# Patient Record
Sex: Female | Born: 1960 | Race: White | Hispanic: No | Marital: Married | Smoking: Former smoker
Health system: Southern US, Community
[De-identification: ages and names within clinical notes are randomized; demographics above are authoritative.]

## PROBLEM LIST (undated history)

## (undated) DIAGNOSIS — E785 Hyperlipidemia, unspecified: Secondary | ICD-10-CM

## (undated) DIAGNOSIS — IMO0001 Reserved for inherently not codable concepts without codable children: Secondary | ICD-10-CM

## (undated) DIAGNOSIS — I2510 Atherosclerotic heart disease of native coronary artery without angina pectoris: Secondary | ICD-10-CM

## (undated) HISTORY — PX: CHOLECYSTECTOMY: SHX55

## (undated) HISTORY — PX: TRIGGER FINGER RELEASE: SHX641

## (undated) HISTORY — PX: ABDOMINAL HYSTERECTOMY: SHX81

## (undated) HISTORY — DX: Atherosclerotic heart disease of native coronary artery without angina pectoris: I25.10

## (undated) HISTORY — PX: TUBAL LIGATION: SHX77

## (undated) HISTORY — DX: Hyperlipidemia, unspecified: E78.5

## (undated) HISTORY — DX: Reserved for inherently not codable concepts without codable children: IMO0001

---

## 2001-03-02 ENCOUNTER — Encounter: Payer: Self-pay | Admitting: Emergency Medicine

## 2001-03-02 ENCOUNTER — Inpatient Hospital Stay (HOSPITAL_COMMUNITY): Admission: EM | Admit: 2001-03-02 | Discharge: 2001-03-05 | Payer: Self-pay | Admitting: Emergency Medicine

## 2001-03-04 ENCOUNTER — Encounter: Payer: Self-pay | Admitting: Internal Medicine

## 2001-03-10 ENCOUNTER — Encounter: Admission: RE | Admit: 2001-03-10 | Discharge: 2001-06-08 | Payer: Self-pay | Admitting: Internal Medicine

## 2001-07-27 ENCOUNTER — Inpatient Hospital Stay (HOSPITAL_COMMUNITY): Admission: EM | Admit: 2001-07-27 | Discharge: 2001-07-29 | Payer: Self-pay | Admitting: Emergency Medicine

## 2001-07-28 ENCOUNTER — Encounter: Payer: Self-pay | Admitting: Internal Medicine

## 2001-09-23 ENCOUNTER — Encounter: Admission: RE | Admit: 2001-09-23 | Discharge: 2001-12-22 | Payer: Self-pay | Admitting: Anesthesiology

## 2002-12-24 ENCOUNTER — Encounter: Payer: Self-pay | Admitting: Emergency Medicine

## 2002-12-24 ENCOUNTER — Emergency Department (HOSPITAL_COMMUNITY): Admission: EM | Admit: 2002-12-24 | Discharge: 2002-12-24 | Payer: Self-pay | Admitting: Emergency Medicine

## 2004-03-20 ENCOUNTER — Encounter: Admission: RE | Admit: 2004-03-20 | Discharge: 2004-03-20 | Payer: Self-pay | Admitting: Internal Medicine

## 2005-08-05 ENCOUNTER — Emergency Department (HOSPITAL_COMMUNITY): Admission: EM | Admit: 2005-08-05 | Discharge: 2005-08-05 | Payer: Self-pay | Admitting: Emergency Medicine

## 2006-08-11 ENCOUNTER — Inpatient Hospital Stay (HOSPITAL_COMMUNITY): Admission: AD | Admit: 2006-08-11 | Discharge: 2006-08-19 | Payer: Self-pay | Admitting: Cardiovascular Disease

## 2006-08-12 ENCOUNTER — Encounter: Payer: Self-pay | Admitting: Cardiovascular Disease

## 2006-08-13 ENCOUNTER — Encounter: Payer: Self-pay | Admitting: Vascular Surgery

## 2006-08-30 ENCOUNTER — Inpatient Hospital Stay (HOSPITAL_COMMUNITY): Admission: EM | Admit: 2006-08-30 | Discharge: 2006-09-02 | Payer: Self-pay | Admitting: Emergency Medicine

## 2006-08-30 ENCOUNTER — Encounter: Payer: Self-pay | Admitting: Cardiology

## 2006-09-02 ENCOUNTER — Emergency Department (HOSPITAL_COMMUNITY): Admission: EM | Admit: 2006-09-02 | Discharge: 2006-09-03 | Payer: Self-pay | Admitting: Emergency Medicine

## 2006-09-03 ENCOUNTER — Inpatient Hospital Stay (HOSPITAL_COMMUNITY): Admission: EM | Admit: 2006-09-03 | Discharge: 2006-09-07 | Payer: Self-pay | Admitting: Emergency Medicine

## 2006-09-11 ENCOUNTER — Ambulatory Visit: Payer: Self-pay | Admitting: Internal Medicine

## 2006-10-08 HISTORY — PX: CORONARY ARTERY BYPASS GRAFT: SHX141

## 2006-11-29 ENCOUNTER — Observation Stay (HOSPITAL_COMMUNITY): Admission: EM | Admit: 2006-11-29 | Discharge: 2006-11-29 | Payer: Self-pay | Admitting: Emergency Medicine

## 2007-01-03 ENCOUNTER — Ambulatory Visit: Payer: Self-pay | Admitting: Thoracic Surgery (Cardiothoracic Vascular Surgery)

## 2007-01-31 ENCOUNTER — Ambulatory Visit (HOSPITAL_BASED_OUTPATIENT_CLINIC_OR_DEPARTMENT_OTHER): Admission: RE | Admit: 2007-01-31 | Discharge: 2007-01-31 | Payer: Self-pay | Admitting: Orthopedic Surgery

## 2007-11-28 IMAGING — CT CT ANGIO ABDOMEN
4 of 6 series · 11 of 46 positions shown, 18 images · IV contrast (APPLIED)
Comparison: CT abdomen 08/30/2006

CLINICAL DATA: Chest pain, back pain, question pulmonary embolus or dissection.

CT ANGIOGRAPHY OF CHEST - PULMONARY EMBOLISM PROTOCOL:
TECHNIQUE: Multidetector CT imaging of the chest was performed according to the
protocol for detection of pulmonary embolism during bolus injection of
intravenous contrast.  Coronal and sagittal plane CT angiographic image
reconstructions were also generated.
Contrast:  100 cc Omnipaque 300
TECHNIQUE: Multidetector CT imaging of the abdomen was performed before and
during bolus injection of intravenous contrast.  Multiplanar CT angiographic
image reconstructions were generated to evaluate the vascular anatomy.

[Series 2: without 5.0 st · axial · non-contrast · 0.63mm/px · z∈[-364,-109]mm · 4 of 86 slices shown, 9 images]
[im 18/86  soft-tissue]
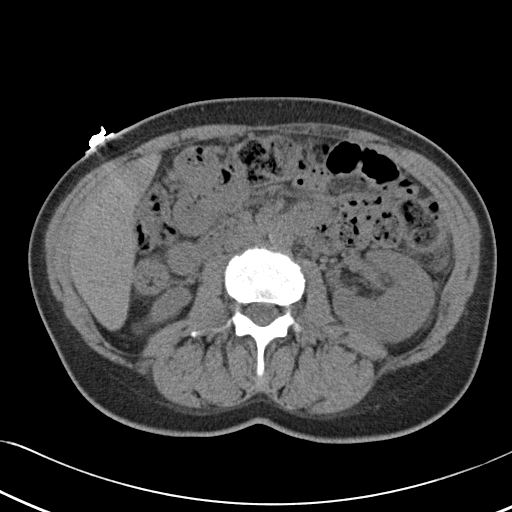
[im 18/86  lung]
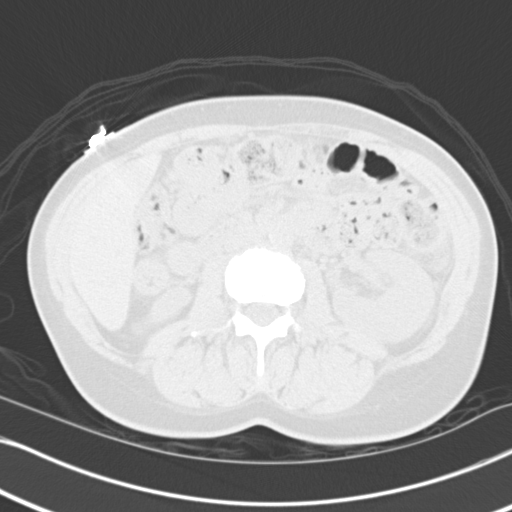
[im 18/86  bone]
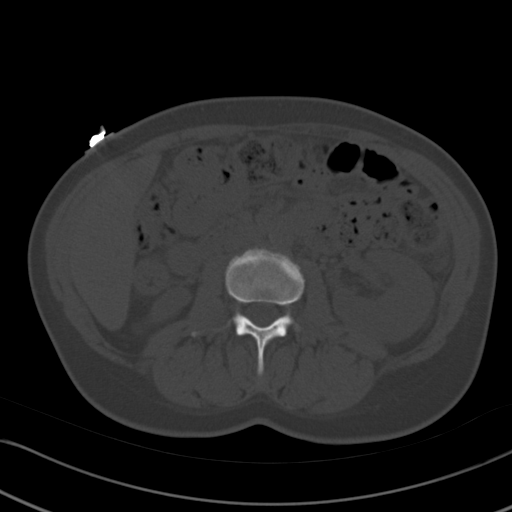
[im 35/86  soft-tissue]
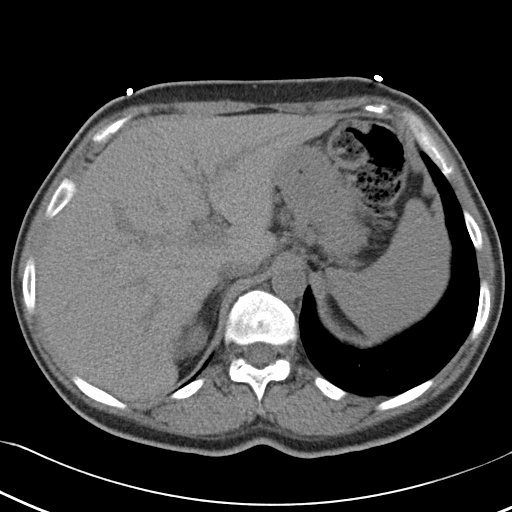
[im 35/86  lung]
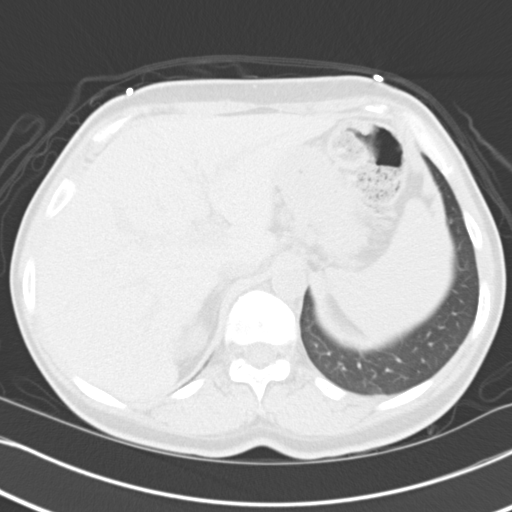
[im 52/86  soft-tissue]
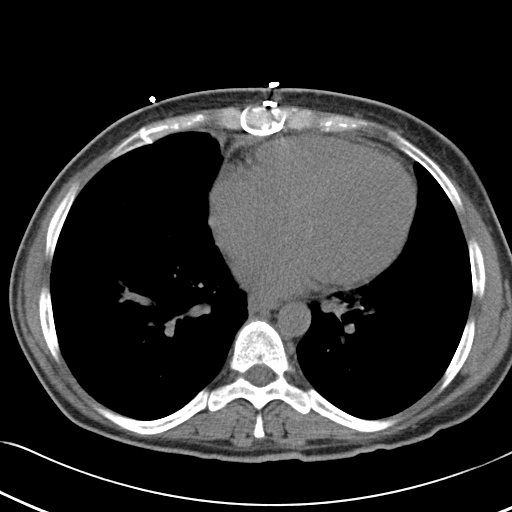
[im 52/86  lung]
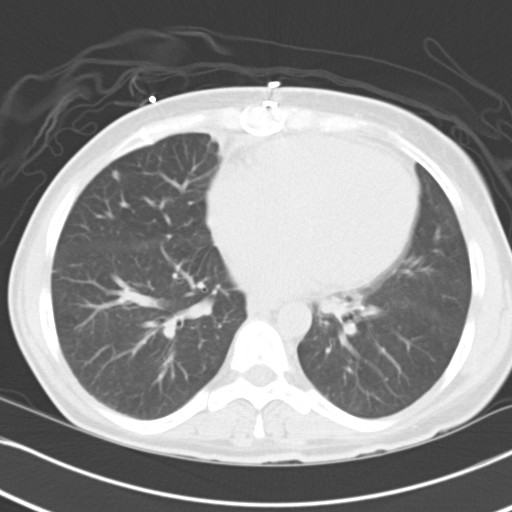
[im 69/86  soft-tissue]
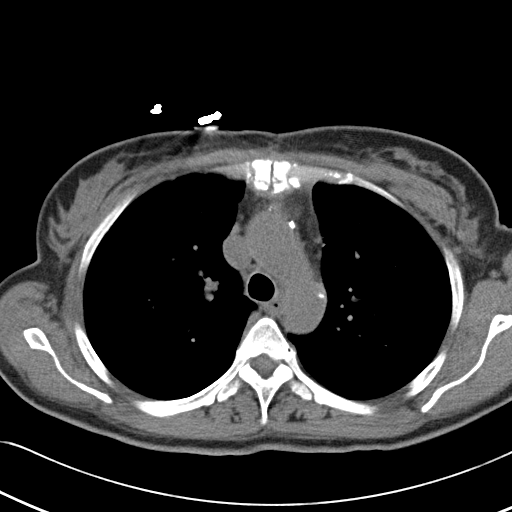
[im 69/86  lung]
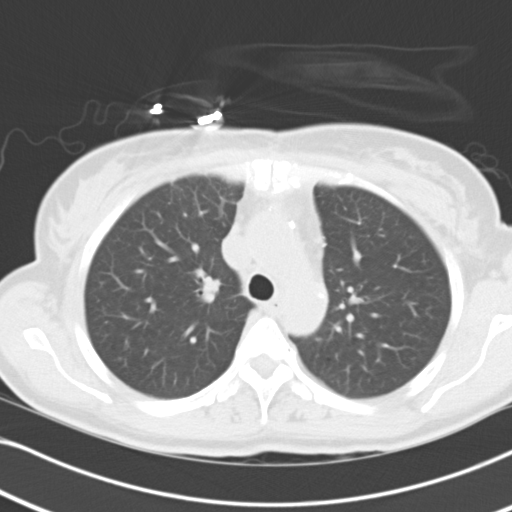

[Series 5: dissection 2.0 st · axial · 0.64mm/px · z∈[-402,-308]mm · 3 of 205 slices shown]
[im 16/205  soft-tissue]
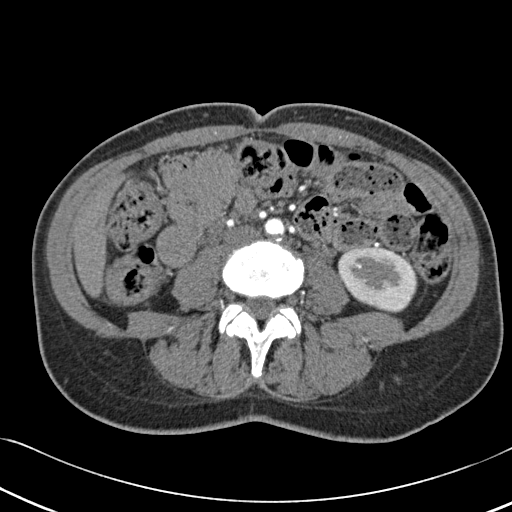
[im 48/205  soft-tissue]
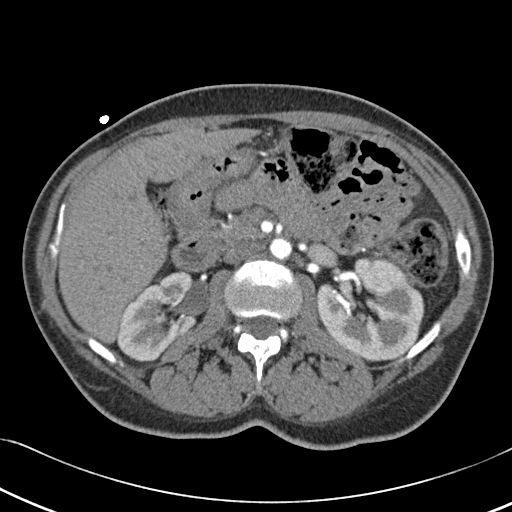
[im 63/205  soft-tissue]
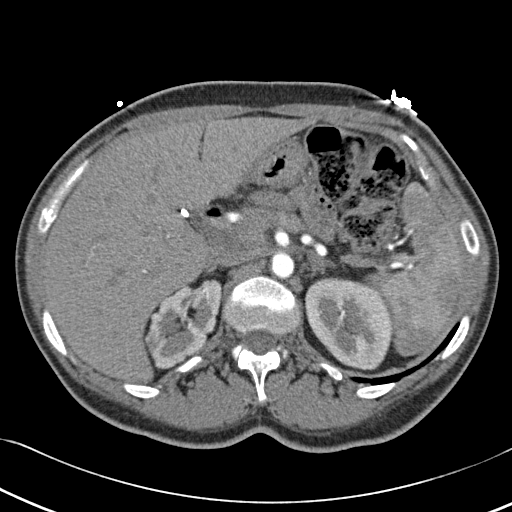

[Series 8: dissection 2.0 st cor · coronal · 0.99mm/px · 3 of 114 slices shown, 4 images]
[im 38/114  soft-tissue]
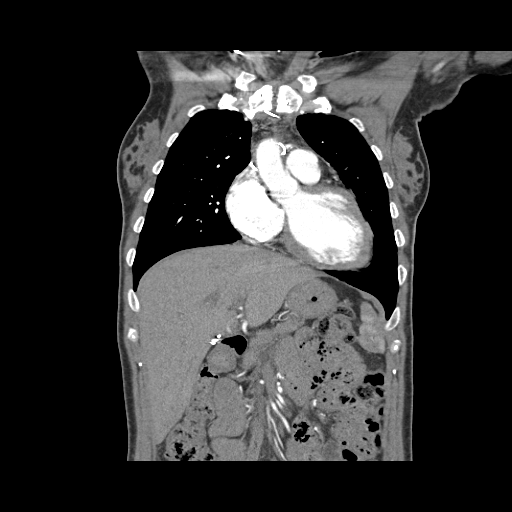
[im 51/114  soft-tissue]
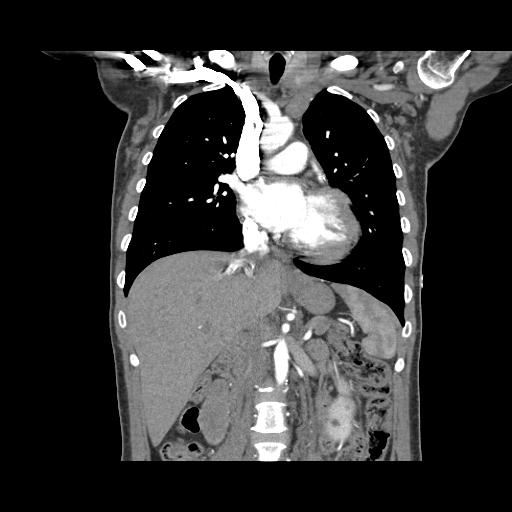
[im 51/114  bone]
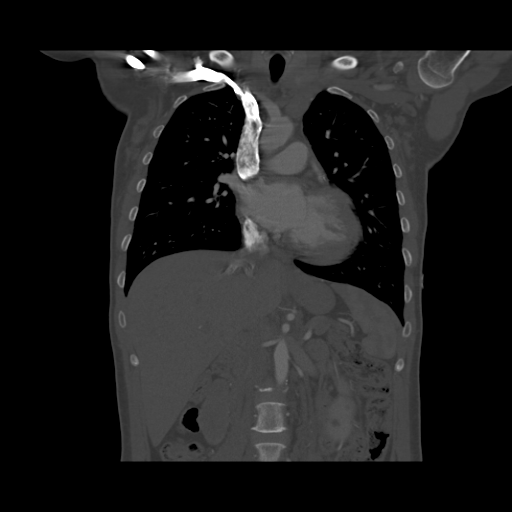
[im 63/114  soft-tissue]
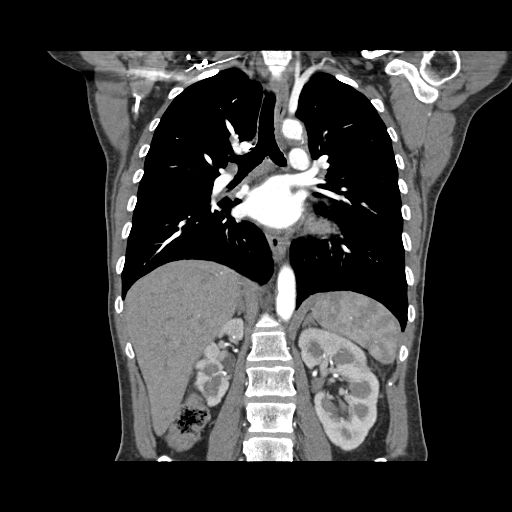

[Series 9: dissection 2.0 st sag · sagittal · 0.99mm/px · 1 of 152 slices shown, 2 images]
[im 51/152  soft-tissue]
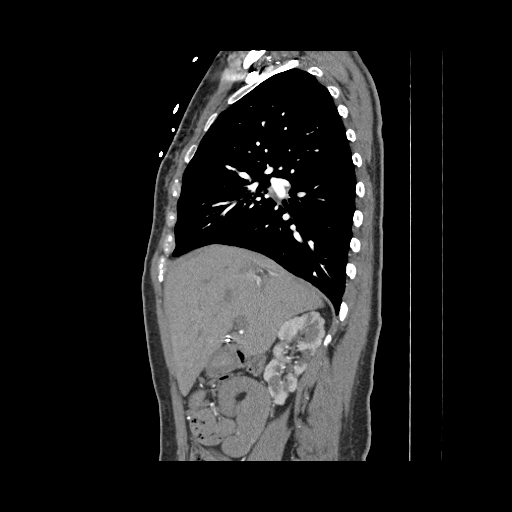
[im 51/152  bone]
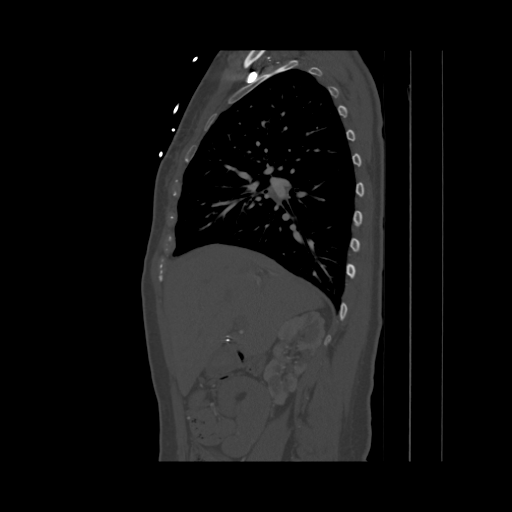

[11 of 46 positions shown; findings below may reference images not displayed]

FINDINGS: No filling defects are seen the pulmonary arteries to suggest
pulmonary emboli. Patient status post CABG. There is no evidence of aortic
aneurysm or dissection. There is mild plaque formation in the descending
thoracic aorta.

There is mild cardiomegaly. No pericardial or pleural effusion. There is mild
peribronchial thickening without focal airspace opacity.

No focal bony abnormality. There is a small calcification within the left
thyroid lobe which may be associated with a small nodule.
IMPRESSION: No evidence pulmonary embolus or aortic dissection. No aneurysm.

Prior CABG.

Mild bronchitis. 

Small calcification in the left thyroid lobe, with possible associated small
nodule. As can be further evaluated with thyroid ultrasound.

CT ANGIOGRAPHY OF ABDOMEN
FINDINGS: No evidence of aortic aneurysm or dissection. There are 2 left renal
arteries and one right renal artery. No evidence of renal artery stenosis. All 3
mesenteric vessels are widely patent.

There is cortical atrophy and scarring involving the right kidney. Given the
normal right renal artery, this most likely represents chronic reflux
nephropathy.

Otherwise solid organs have been unremarkable appearance. No focal bony
abnormality. No free fluid, free air, or adenopathy.

IMPRESSION

No evidence of aortic aneurysm or dissection.

Cortical atrophy/scarring within the right kidney, likely chronic reflux
nephropathy.

## 2009-03-06 ENCOUNTER — Emergency Department (HOSPITAL_BASED_OUTPATIENT_CLINIC_OR_DEPARTMENT_OTHER): Admission: EM | Admit: 2009-03-06 | Discharge: 2009-03-06 | Payer: Self-pay | Admitting: Emergency Medicine

## 2009-03-06 ENCOUNTER — Ambulatory Visit: Payer: Self-pay | Admitting: Diagnostic Radiology

## 2009-07-06 ENCOUNTER — Encounter: Payer: Self-pay | Admitting: Cardiovascular Disease

## 2010-07-05 ENCOUNTER — Ambulatory Visit: Payer: Self-pay | Admitting: Cardiovascular Disease

## 2010-09-19 ENCOUNTER — Ambulatory Visit: Payer: Self-pay | Admitting: Cardiovascular Disease

## 2010-11-14 ENCOUNTER — Other Ambulatory Visit: Payer: Self-pay | Admitting: Internal Medicine

## 2010-11-14 ENCOUNTER — Ambulatory Visit
Admission: RE | Admit: 2010-11-14 | Discharge: 2010-11-14 | Disposition: A | Discharge status: Home / Self Care | Payer: Managed Care, Other (non HMO) | Source: Ambulatory Visit | Attending: Internal Medicine | Admitting: Internal Medicine

## 2010-11-14 DIAGNOSIS — R52 Pain, unspecified: Secondary | ICD-10-CM

## 2011-01-16 LAB — URINE MICROSCOPIC-ADD ON

## 2011-01-16 LAB — URINE CULTURE

## 2011-01-16 LAB — URINALYSIS, ROUTINE W REFLEX MICROSCOPIC
Glucose, UA: NEGATIVE mg/dL
Nitrite: POSITIVE — AB
Specific Gravity, Urine: 1.016 (ref 1.005–1.030)
pH: 6 (ref 5.0–8.0)

## 2011-02-23 NOTE — Discharge Summary (Signed)
NAMEJANNELL, [name] NO.:  [id number]   MEDICAL RECORD NO.:  [id number]          PATIENT TYPE:  INP   LOCATION:  2004                         FACILITY:  [facility]   PHYSICIAN:  Salvatore Decent. Cornelius Moras, M.D. DATE OF BIRTH:  1961/01/20   DATE OF ADMISSION:  08/11/2006  DATE OF DISCHARGE:  08/19/2006                                 DISCHARGE SUMMARY   PRIMARY ADMITTING DIAGNOSIS:  Shortness of breath.   ADDITIONAL/DISCHARGE DIAGNOSES:  1. Flash pulmonary edema.  2. Severe three-vessel coronary artery disease.  3. Severe left ventricular dysfunction.  4. Class IV congestive heart failure.  5. Insulin dependent diabetes mellitus.  6. Hyperlipidemia.  7. History of tobacco abuse.  8. Questionable pre admission acute myocardial infarction.  9. Postoperative blood loss anemia.   PROCEDURE PERFORMED:  1. Cardiac catheterization.  2. Coronary artery bypass grafting x4 (left internal mammary artery to the      distal left anterior descending, saphenous vein graft to the first      diagonal, saphenous vein graft to the ramus intermedius, saphenous vein      graft to the distal right coronary artery).  3. Endoscopic vein harvest right leg.   HISTORY:  The patient is a 50 year old female who presented to the emergency  department at [facility] on the date of this admission complaining with  diaphoresis and episodic chest pain.  She had been having progressive  dyspnea and fatigue over the several days preceding the admission as well as  some diaphoresis and backache.  On exam in the emergency department she was  found to have pulmonary edema and was treated with IV Lasix and a low-dose  nitroglycerin drip and was transferred to [facility] for admission and  further cardiac workup.   HOSPITAL COURSE:  Ms. Lovins was ruled out for an acute myocardial  infarction although her clinical history was suggestive of possible  myocardial infarction prior to admission.  She underwent  cardiac  catheterization which showed severe three-vessel coronary artery disease  which was not felt to be amenable to percutaneous intervention.  There was  also some question as to whether she may have an atrial septal defect, but  transesophageal echocardiogram was performed and showed no evidence of an  ASD.  She was seen in consultation by Dr. Tressie Stalker for consideration of  surgical revascularization.  After evaluation of the patient and review of  her films Dr. Cornelius Moras felt that her best course of action would be to proceed  with CABG at this time.  He explained the risks, benefits and alternatives  of surgery to the patient and she agreed to proceed.  She underwent a  complete preoperative workup including carotid Doppler studies which showed  no evidence of ICA stenosis bilaterally and lower extremity Doppler studies  which demonstrated ABIs of 0.97 on the right and 0.92 on the left.  She  remained stable during her preoperative workup and was taken to the  operating room on 08/15/2006 where she underwent CABG x4 as described in  detail above performed by Dr. Cornelius Moras.  She tolerated the procedure well  and  was transferred to the SICU in stable condition.  She was able to be  extubated shortly after surgery.  She was hemodynamically stable and doing  well on postop day #1 although she did require a low-dose Neo-Synephrine  drip for some mild hypotension.  This was weaned and discontinued over the  course of her first postoperative day.  Her chest tubes and hemodynamic  monitoring devices were removed and she was able to ambulate with cardiac  rehab phase one.  She remained in the unit for further observation.  By  postop day #3 she was ready for transfer to the floor.  She was started back  on her home insulin regimen once her p.o. intake began to improve.  Also  once her blood pressure stabilized she was started on an ACE inhibitor and a  beta blocker.  She was found to have a mild  blood loss anemia which was not  severe enough to require transfusion and she was subsequently started on  oral iron and folic acid.  She was diuresed for mild volume overload.  She  continued to make slow and steady progress.  Her incisions are all healing  well.  She has been diuresed back down to around her preoperative weight  with no lower extremity edema on physical exam.  She has remained afebrile  and vital signs have been stable.  She is maintaining normal sinus rhythm.  Her labs on postop day #4 show hemoglobin of 9.4, hematocrit 28, platelet  302, white count 12.8, sodium 139, potassium 3.7, BUN 16, creatinine 0.6.  She has been seen and evaluated this morning by Dr. Cornelius Moras and by Dr. Elease Hashimoto  and it is felt that she has remained stable and is doing so well she is  ready for discharge home.   Discharge medications are as follows.  1. Aspirin 325 mg daily.  2. Toprol XL 25 mg daily.  3. Lisinopril 20 mg daily.  4. Lipitor 40 mg nightly  5. Zoloft 100 mg daily.  6. Neurontin 600 mg four times a day.  7. Lasix 40 mg daily times a 5 days.  8. Potassium 20 mEq daily x5 days.  9. Niferex 150 mg daily.  10.Folic acid 1 mg daily.  11.Humulin 50 units subcu q. a.m. and 35 units subcu q. p.m.  12.Oxycodone 5 mg q. 4-6 hours p.r.n. for pain.   DISCHARGE INSTRUCTIONS:  She is asked to refrain from driving, heavy lifting  or strenuous activity.  She may continue ambulating daily and using her  incentive spirometer.  She may shower daily and clean her incisions with  soap and water.  She will continue low-fat, low-sodium, carbohydrate  modified diet.   DISCHARGE FOLLOWUP:  She is asked to call and schedule an appointment in 2  weeks to see Dr. Elease Hashimoto.  She will be contacted by the CVTS office with an  appointment see Dr. Cornelius Moras in 3 weeks.  She is also asked to follow up with  her medical doctor for recheck of her blood sugars in the next 1-2 weeks. If she experiences any problems or has  questions in the interim she is asked  to contact our office immediately.      Coral Ceo, P.A.      Salvatore Decent. Cornelius Moras, M.D.  Electronically Signed    GC/MEDQ  D:  08/19/2006  T:  08/19/2006  Job:  [id number]   cc:   Salvatore Decent. Cornelius Moras, M.D.  Olene Craven, M.D.  Thayer Headings, M.D.

## 2011-02-23 NOTE — H&P (Signed)
NAMELADELL, BEY NO.:  [id number]   MEDICAL RECORD NO.:  [id number]          PATIENT TYPE:  EMS   LOCATION:  ED                           FACILITY:  [facility]   PHYSICIAN:  Vesta Mixer, M.D. DATE OF BIRTH:  [birth year]   DATE OF ADMISSION:  08/11/2006  DATE OF DISCHARGE:                                HISTORY & PHYSICAL   [name] is a 50 year old female with a history of diabetes mellitus.  She is admitted with severe dyspnea for the past week or so and flash  pulmonary edema.   The patient has had poorly controlled diabetes for the last 12 years.  Her  last hemoglobin A1c was in the 8 range.   Seven days ago she started having some episodes of chest pain.  This chest  pain was described as a deep pressure with radiation through to her back.  It was associated with diaphoresis and what she thought was heartburn.  She  developed cold clammy sensation.  She took an aspirin and felt somewhat  better after about 30 minutes.  She felt well for several days and then  started having some progressive dyspnea.  She also noticed progressive  fatigue since that time.  She felt a little bit better on Thursday, Friday  and yesterday and then finally today she felt much worse.  Today she was not  able to even got to the bathroom without having severe dyspnea.  She had to  stop and rest just to walk  as little as 20 feet.  She has not been able to  lie down because of her shortness of breath.  She has not had much in the  way of chest pain but now has a little bit of a right-sided chest ache as  well as a backache.  She presented to the [facility] emergency room and was  found to have flash pulmonary edema.  She received some IV Lasix and low-  dose nitroglycerin drip and is feeling much better.   CURRENT MEDICATIONS:  1. Insulin NPH 50 units in morning with 35 units in the evening.  2. Humalog sliding scale.  3. Neurontin 600 mg 4 x day.  4. Vicodin 1 tablet  4 x day.  5. Zoloft 100 mg a day.  6. Lovastatin once a day.   ALLERGIES:  None.   PAST MEDICAL HISTORY:  Diabetes mellitus.   SOCIAL HISTORY:  The patient smokes somewhere less than a pack of cigarettes  a day.  She drinks alcohol rarely.   FAMILY HISTORY:  Positive for coronary artery disease.   REVIEW OF SYSTEMS:  Reviewed and is essentially negative except as noted in  the HPI.   PHYSICAL EXAMINATION:  GENERAL:  She is a young female in no acute distress.  She is alert and oriented x3.  Mood and affect are normal.  VITALS:  Her heart rate is 97, blood pressure is 114/71.  HEENT exam reveals 2+ carotids.  She has no bruits, no JVD, no thyromegaly.  LUNGS:  Lung exam reveals rales bilaterally.  HEART:  Regular rate S1-S2  with an S3 gallop.  ABDOMEN:  Reveals good bowel sounds and is nontender.  EXTREMITIES:  She has no clubbing, cyanosis or edema.  Her femoral pulses  are 2+.   Chest x-ray reveals significant bilateral pulmonary edema. Her EKG reveals  normal sinus rhythm.  She has ST-segment depression in leads II, III and  AVF.  She also has ST-segment depression in C5 and C6.  She has poor R-wave  progression.  She has small Q-waves in leads II, III and AVF.   Her troponin level is 0.37.  CPK-MB is negative.   IMPRESSION AND PLAN:  1. [name] presents with flash pulmonary edema.  By her EKG history and      enzymes, I suspect that she had a myocardial infarction starting last      week.  She now has subsequent congestive heart failure.  She is not      having any acute pain now.  She has responded nicely to nitroglycerin      and Lasix.  At this point I would like to get her out of her congestive      heart failure.  We will continue the IV Lasix twice a day for the next      several days.  We will increase her nitroglycerin drip to 3 mL an hour.      We will check a BMP tomorrow.   Will get an echocardiogram tomorrow to evaluate her left ventricular  systolic  function.  I anticipate doing heart catheterization Tuesday  afternoon.  As long she does not have recurrent chest pain or worsening  dyspnea then I think that we can wait that long.  I would certainly prefer  to get her out of congestive heart failure before we do an angiogram.   We will check a fasting lipid profile tomorrow.   1. Diabetes mellitus.  Will check Accu-Chek's and continue her home      medications.  We will give her sliding scale insulin as needed.           ______________________________  Vesta Mixer, M.D.     PJN/MEDQ  D:  08/11/2006  T:  08/12/2006  Job:  [id number]   cc:   Olene Craven, M.D.  Fax: [phone]

## 2011-02-23 NOTE — Discharge Summary (Signed)
[name], [name] NO.:  [id number]   MEDICAL RECORD NO.:  [id number]          PATIENT TYPE:  INP   LOCATION:  2008                         FACILITY:  [facility]   PHYSICIAN:  Vesta Mixer, M.D. DATE OF BIRTH:  06-Mar-1961   DATE OF ADMISSION:  11/28/2006  DATE OF DISCHARGE:  11/29/2006                               DISCHARGE SUMMARY   DISCHARGE DIAGNOSES:  1. Noncardiac chest pain.  2. History of coronary artery disease.  3. Diabetes mellitus.  4. Dyslipidemia.   DISCHARGE MEDICATIONS:  1. Aspirin 81 mg a day.  2. Hydrocodone as needed.  3. Sertraline 100 mg a day.  4. Gabapentin 600 mg four times a day.  5. Metoprolol 25 mg a day.  6. Protonix 40 mg (or Prilosec 20 mg each day).  7. Reglan 10 mg with meals.  8. Lisinopril 20 mg a day.  9. Lipitor 40 mg a day.  10.Insulin as you were previously doing.  11.Nitroglycerin as needed.   DISPOSITION:  The patient will return to see Dr. Elease Hashimoto on December 16, 2006.  She is to call sooner if she has any problems.   HISTORY:  [name] is a 50 year old female with a history of coronary  artery disease-status post coronary artery bypass graft.  She was  admitted to the hospital with episodes of chest pain.  Please see  dictated H&P for further details.   HOSPITAL COURSE:  Problem 1:  CHEST PAIN:  The patient was admitted with  chest pain.  She ruled out for  myocardial infarction.  She had negative  cardiac enzymes.  We discontinued the heparin and nitroglycerin and she  did not have any further problems.  She is now discharged in  satisfactory condition.   Problem 2:  DIABETES MELLITUS:  The patient's glucose levels were fairly  elevated during her hospitalization.  Most of this was because she had  been taken off her normal insulin regimen.  She is to resume her normal  insulin dose and she is to follow up with her general medical doctor as  needed.  All of her other medical problems are stable.        ______________________________  Vesta Mixer, M.D.     PJN/MEDQ  D:  11/29/2006  T:  11/30/2006  Job:  [id number]

## 2011-02-23 NOTE — Discharge Summary (Signed)
[name], CAPELLE NO.:  [id number]   MEDICAL RECORD NO.:  [id number]          PATIENT TYPE:  INP   LOCATION:  5530                         FACILITY:  [facility]   PHYSICIAN:  Hettie Holstein, D.O.    DATE OF BIRTH:  Mar 08, 1961   DATE OF ADMISSION:  09/03/2006  DATE OF DISCHARGE:  09/07/2006                               DISCHARGE SUMMARY   PRIMARY CARE PHYSICIAN:  Olene Craven, M.D.   GASTROENTEROLOGIST:  Jordan Hawks. Elnoria Howard, MD   CVTS SURGEON:  Salvatore Decent. Cornelius Moras, M.D.   REASON FOR ADMISSION:  Intractable nausea, vomiting, and abdominal pain.   PRINCIPAL DIAGNOSIS:  1. 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  Ms. Kuper was seen by Dr. Elnoria Howard as well as Dr. Leone Payor.      On the day of discharge, Dr. Leone Payor recommended transitioning her      to Reglan p.o. and Protonix.  She had no further emesis or nausea      after 24 hour p.o. trial and IV Reglan.  2. Helicobacter pylori antibody positive; however, no confirmatory CLO      test or urease breath test was performed to confirm.  Dr. Leone Payor      recommends a followup to confirm this in the outpatient setting,      although she would need to be off of proton pump inhibitor two      weeks prior to testing.  Otherwise, she could undergo eradication      treatment for this, as she has not undergone this in the past.      This will be deferred to her primary care physician, Dr. Barbee Shropshire,      who I am requesting to see her in followup in the coming week.  3. Diabetes mellitus with poor control, as revealed by hemoglobin A1C      of 8.3 on August 14, 2006 around her perioperative period for her      CABG.  4. Coronary artery disease, status post coronary artery bypass      grafting by Dr. Tressie Stalker on August 15, 2006.  5. Diabetic neuropathy affecting lower extremities.  6. Transient hypokalemia, repleted.   DISPOSITION:  Ms. Albano is felt to be in medically stable and improved  condition for discharge to follow up with Dr. Barbee Shropshire this week for  consideration of tighter management of her diabetes as an exacerbating  factor of her gastroparesis.   DISCHARGE MEDICATIONS:  1. Reglan 10 mg q.a.c. and nightly.  2. Protonix 40 mg daily.   She is instructed to limit her pain medication as much as possible,  as  this is certainly has a possibility of contributing to further  exacerbations.  She should resume lisinopril 20 mg daily as before,  gabapentin 600 mg 4 times a day as before, Lipitor 40 mg daily as  before, promethazine 25 mg as needed for nausea, hydromorphone 2 mg  q.6h. p.r.n., as noted above, recommendations to refrain from as much as  possible.  Hydrocodone/APAP 5/500 p.r.n. q.6h.  Metoprolol extended  release 25 mg daily, Humulin N 50 units q.a.m. and Humulin 35 units  nightly and a Humalog sliding scale that she was on at the home.  Again,  she was instructed to tighten control in the outpatient setting.   HISTORY OF PRESENT ILLNESS:  Ms. Pesnell is a very pleasant 50 year old  female who presented to the hospital with nausea, vomiting, and  abdominal pain.  She has a known history of type 1 diabetes mellitus and  coronary artery disease with recent coronary artery bypass grafting per  Dr. Cornelius Moras x4 vessel.  In any event, she had a recent hospitalization and  was discharged home; however, had recurrence of symptoms with bilious  vomiting.  She was admitted for hydration and further management.   HOSPITAL COURSE:  Ms. Macbride was seen in consultation by  gastroenterology initially.  She had initial plain films as  well as an  ultrasound of the abdomen.  Ultrasound of her abdomen revealed  surgically absent gallbladder, only mildly dilated extra-hepatic biliary  tree, mildly atrophic right kidney, and questionable compensatory  hypertrophy of the left kidney.  In any event, there was some concern  for perhaps mesenteric ischemia.  She underwent mesenteric arteriogram  on September 04, 2006, which revealed a patent SMA, IMA, and celiac  branch vessels.  She continued with discomfort.  We subsequently  proceeded to work up delayed gastric emptying.  She was unable to  complete the first attempt at this study due to nausea and vomiting.  She had subsequent attempt and was successful.  This did reveal delayed  gastric emptying.  Diet was attempted following, and she responded to  Reglan and tolerated the diet without emesis or vomiting.  She has felt  the best that she has felt in quite some time.  With the initiation of  Reglan, she is seen by Dr. Leone Payor with the recommendations as outlined  above.  Of note, her H. pylori antibody was positive.  She denies known  treatment for this in the past, and this further treatment is deferred  to her outpatient physician.  It is felt that she should resolve this  acute event here prior to treatment of this.  In addition, she can also  undergone confirmatory testing, including CLO test or urease breath  test.   In review of her history, it is noted that she did have a hemoglobin A1C  reflecting poor control.  This will be very important to manage in the  outpatient setting.   Out of sequence, though, Ms. Guyette did undergo an EGD that revealed  only diffuse gastritis.  This was prior to pursuit of mesenteric  angiogram.      Hettie Holstein, D.O.  Electronically Signed     ESS/MEDQ  D:  09/07/2006  T:  09/07/2006  Job:  [id number]   cc:   Olene Craven, M.D.  Jordan Hawks Elnoria Howard, MD  Salvatore Decent. Cornelius Moras, M.D. Vesta Mixer, M.D.  Anselmo Rod, M.D.

## 2011-02-23 NOTE — Discharge Summary (Signed)
[name]. [facility]  Patient:    [name], [name]                      MRN: [id number] Adm. Date:  [id number] Disc. Date: 03/05/01 Attending:  Jetty Duhamel T CC:         Kern Reap, M.D.   Discharge Summary  DISCHARGE DIAGNOSES: 1. Diabetic ketoacidosis. 2. Uncontrolled type 2 diabetes - diagnosed six years ago. 3. Depression. 4. Tobacco abuse. 5. Borderline hyperlipidemia per history.  DISCHARGE MEDICATIONS: 1. Actos 30 mg q.d. 2. Amaryl 4 mg q.d. 3. NPH insulin - 10 units in the morning and 5 units in the evening. 4. Humalog sliding scale - for temporary transition while Actos and Amaryl    are initiated. 5. Zoloft 100 mg p.o. q.d. 6. Doxepin 10 mg q. h.s. p.r.n.  FOLLOWUP:  The patient will follow up with Dr. Kern Reap at [facility] on Wednesday, March 12, 2001, at 10:15 in the morning.  She is instructed to call the [facility] until that time if she has questions concerning her diabetes or blood sugars.  PROCEDURES:  None.  CONSULTATIONS:  None.  HISTORY OF PRESENT ILLNESS:  [name] is a 50 year old female with insulin-dependent type 2 diabetes who was previously followed by Dr. Record in [place], [place].  She presented to the hospital after a two day history of vomiting and extreme nausea with limited p.o. intake.  On the date of admission, she noticed blood sugars in the 400 to 500 range and without ability to take in orals, was unsure as to whether to dose herself with insulin or not.  She presented to the emergency room for evaluation and was found to be in diabetic ketoacidosis and, therefore, was admitted for control.  HOSPITAL COURSE:  #1 - DIABETIC KETOACIDOSIS:  The patient was admitted to the hospital on Mar 02, 2001, with the diagnosis of diabetic ketoacidosis.  Bicarb was noted to be 7, with an anion gap of 20.  Arterial blood gas revealed pH of 7.16. Admission blood sugar was  600.  The patient was placed on Glucometer protocol and hydrated aggressively.  Blood sugars were followed closing, according to the Glucometer protocol.  She responded nicely to this therapy and anion gap was closed within the first 24 hours of admission.  She was transitioned to longstanding NPH insulin with additional Humalog sliding scale.  The patient tolerated this intervention without difficulty.  Anion gap continued to be normal during the remainder of hospitalization.  The patient was tolerating NPH insulin with sliding scale with blood sugars ranging from the 120 to 200 range throughout the remainder of hospitalization.  At the time of discharge, after discussion with Dr. Barbee Shropshire, the decision was made to increase the patients oral hypoglycemic agents.  The medications noted above were prescribed.  Additionally, the patients NPH insulin was decreased in an attempt to decrease the possibility of severe hypoglycemia.  In order to combat hyperglycemia, because of delay in onset of oral hypoglycemics combined with decreasing pH, the patient was placed on Humalog sliding scale and this was explained to her at length prior to her discharge.  The patient voiced understanding.  She is to check CBGs b.i.d. and treat them with her regular insulin, as well as continue the prescribed NPH.  She understands that she will call the [facility] prior to her visit with Dr. Barbee Shropshire for questions concerning her CBG.  She has been consulted  to return to the emergency room for CBGs less than 50 and to call her physician for CBGs continuously less than 70.  Likewise, she is instructed to call her physician for blood sugars that are continuously higher than 350.  The etiology of her DKA was felt to be a viral gastroenteritis as the symptoms of abdominal discomfort and vomiting preceded elevated sugars and acidosis.  #2 - TYPE 2 DIABETES:  The patients diabetes were diagnosed approximately  six years ago.  She, by history, was given a one week trial of Glucotrol alone and then placed on insulin and since that time has not been on any oral agent with the exception to recently added Avandia at a low dose.  In that the patient has a severe type 2 diabetes, it is likely that she may require insulin continuously, but certainly without a full trial of oral agents we cannot decide at this point that she is not able to be controlled with oral agents alone.  This has been discussed with Dr. Barbee Shropshire and initiation of an oral regimen was accomplished at the time of discharge.  Glucophage will be added in followup, but it was held at this time as the patient is recently recovering from diabetic ketoacidosis with a gastroenteritis and we do not wish to aggravate a tender GI tract at this time.  The patient is being sent for outpatient diabetic education and counseling has been carried out extensively as to proper management of diabetes, as well as diabetic diet during this hospitalization.  #3 - TOBACCO ABUSE:  The relationship between uncontrolled blood sugars and tobacco abuse leading to severe coronary artery disease, as well as peripheral vascular disease and increased risk of stroke and heart attack, was explained at length to the patient.  She voiced understanding.  She is highly motivated to discontinue tobacco altogether.  She was encouraged in this and nicotine patch was used during hospitalization.  The patient states that she already had nicotine patches at home and she will use these to continue to stop. Other behavioral modifications including use of sugar-free gums and candies to replace the habitual act of smoking breaks was also encouraged.  #4 - DEPRESSION:  During this hospitalization, the patient tolerated her Zoloft without difficulty.  She will continue this medication as an outpatient.  #5 - VIRAL GASTROENTERITIS:  It was presumed that the patient was  suffering from a viral gastroenteritis which ultimately led to her DKA.  She was  hydrated aggressively during admission, consistent with treatment of volume depletion from DKA and viral gastroenteritis.  Diet was advanced when DKA was resolving and patients nausea and improved.  At the time of discharge, the patient was tolerating a full diet without significant difficulty.  CONDITION AT DISCHARGE:  Much improved.  Vital signs stable and afebrile. Blood sugar 140. DD:  03/05/01 TD:  03/05/01 Job: [id number]

## 2011-02-23 NOTE — Op Note (Signed)
[name], [name]             ACCOUNT NO.:  [id number]   MEDICAL RECORD NO.:  [id number]          PATIENT TYPE:  AMB   LOCATION:  DSC                          FACILITY:  [facility]   PHYSICIAN:  Katy Fitch. Sypher, M.D. DATE OF BIRTH:  01-Nov-1960   DATE OF PROCEDURE:  01/31/2007  DATE OF DISCHARGE:                               OPERATIVE REPORT   PREOPERATIVE DIAGNOSIS:  Chronic stenosing tenosynovitis, right long  finger, associated with longstanding insulin-dependent diabetes and  multiple collagen complications related to longstanding insulin-  dependent diabetes including palmar fibromatosis and multiple joint  contractures associated with insulin-dependent diabetes.   POSTOPERATIVE DIAGNOSIS:  Chronic stenosing tenosynovitis, right long  finger, associated with longstanding insulin-dependent diabetes and  multiple collagen complications related to longstanding insulin-  dependent diabetes including palmar fibromatosis and multiple joint  contractures associated with insulin-dependent diabetes.   OPERATION:  Release of right long finger A1 pulley, followed by  tenosynovectomy of flexor digitorum profundus and superficialis tendons,  right long finger.   OPERATING SURGEON:  Katy Fitch. Sypher, MD   ASSISTANT:  Annye Rusk, PA-C.   ANESTHESIA:  General by LMA.   SUPERVISING ANESTHESIOLOGIST:  Jairo Ben, MD   INDICATIONS:  [name] is a 50 year old woman with a history of  insulin-dependent diabetes with significant stenosing tenosynovitis of  her long fingers bilaterally.   She has failed nonoperative measures.   She has been challenged to maintain close control of her blood sugar.   Due to a failure to respond to nonoperative measures, she is brought to  the operating room at this time, anticipating release of her right long  finger A1 pulley and other interventions to allow recovery of full range  of motion of her right long finger and release of her  30-degree flexion  contracture of the PIP joint.   PROCEDURE:  [name] was brought to the operating room and placed  in a supine position upon the operating table.  Following anesthesia  consultation with Dr. Jean Rosenthal, we initially selected monitored  anesthesia care.  After informed consent, she was provided IV sedation  followed by infiltration of 2% lidocaine into the flexor sheath and palm  in the region of the anticipated incision.   After routine Betadine scrub and paint, the right arm was exsanguinated  with an Esmarch bandage and an arterial tourniquet on the proximal  brachium inflated to 240 mmHg.   Unfortunately, [name] was unable to tolerate the discomfort of the  tourniquet on her proximal brachium; therefore, her anesthesia was  advanced to general by LMA.   Once she was under stable general anesthesia, we proceeded with a short  oblique incision in the line of the distal palmar crease.  Subcutaneous  tissues were carefully divided, taking care to identify and release the  palmar fascia pretendinous fibers.  The A1 pulley was isolated and found  be significantly fibrotic.  The flexor tendons were obscured by a cuff  of fibrotic tenosynovium proximal to the A1 pulley.   After release of the A1 pulley along its radial border, the flexor  tendons were delivered.  A  complete tenosynovectomy of the superficialis  and profundus tendon was accomplished, removing the preputial fold,  thereby regaining near full range of motion at the interphalangeal  joints.   She had a mild 5-degree flexion contracture at the PIP joint despite the  tenosynovectomy; this will be relieved by postoperative hand therapy.   The wound was inspected for bleeding points and subsequently repaired  with vertical mattress sutures of 5-0 nylon.  A compressive dressings  was applied with Xeroflo, sterile gauze, sterile Webril and an Ace  bandage.  There were no apparent  complications.      Katy Fitch Sypher, M.D.  Electronically Signed     RVS/MEDQ  D:  01/31/2007  T:  01/31/2007  Job:  [id number]   cc:   Vesta Mixer, M.D.  Olene Craven, M.D.

## 2011-02-23 NOTE — Op Note (Signed)
NAMEPENNIE, [name]             ACCOUNT NO.:  [id number]   MEDICAL RECORD NO.:  [id number]          PATIENT TYPE:  INP   LOCATION:  5530                         FACILITY:  [facility]   PHYSICIAN:  Anselmo Rod, M.D.  DATE OF BIRTH:  [birth year]   DATE OF PROCEDURE:  09/03/2006  DATE OF DISCHARGE:                               OPERATIVE REPORT   PROCEDURE PERFORMED:  Esophagogastroduodenoscopy.   ENDOSCOPIST:  Anselmo Rod, MD   INSTRUMENT USED:  Olympus video panendoscope.   INDICATIONS FOR PROCEDURE:  50 year old female with severe epigastric  pain after four vessel bypass done about 3 weeks ago, undergoing EGD to  rule out peptic ulcer disease, esophagitis, gastritis, etc.   PREPROCEDURE PREPARATION:  Informed consent was procured from the  patient.  The patient fasted for 4 hours prior to the procedure.  Risks  and benefits of the procedure were discussed with the patient in great  detail.   PREPROCEDURE PHYSICAL:  The patient had stable vital signs.  NECK:  Supple.  Chest clear to auscultation.  S1, S2 regular.  Abdomen soft  with normal bowel sounds.  Severe epigastric and periumbilical  tenderness on palpation with guarding or rebound, no rigidity, no  hepatosplenomegaly.   DESCRIPTION OF PROCEDURE:  The patient was placed in the left lateral  decubitus position and sedated with 100 mcg of fentanyl and 10 mg of  Versed in slow incremental doses given intravenously.  Once the patient  was adequately sedated and maintained on low-flow oxygen and continuous  cardiac monitoring, the Olympus video panendoscope was advanced through  the mouthpiece over the tongue into the esophagus under direct vision.  The entire esophagus was widely patent with no evidence of ring,  stricture, mass, esophagitis, Barrett's mucosa.  The scope was then  advanced into the stomach.  Diffuse gastritis was noted.  No ulcers,  erosions, mass or polyps were identified.  The proximal small bowel  appeared normal.   IMPRESSION:  Mild diffuse gastritis, otherwise normal EGD.   RECOMMENDATIONS:  1. I have discussed with Art A. Hoss, M.D. and a mesenteric angiogram      will be done today to rule out mesenteric ischemia.  The patient      has diabetes and a history of vascular disease.  Further      recommendations made thereafter.  2. Continue PPI for now.      Anselmo Rod, M.D.  Electronically Signed    JNM/MEDQ  D:  09/05/2006  T:  09/05/2006  Job:  [id number]

## 2011-02-23 NOTE — H&P (Signed)
NAME:  [name], [name] NO.:  [id number]   MEDICAL RECORD NO.:  [id number]          PATIENT TYPE:  EMS   LOCATION:  MAJO                         FACILITY:  [facility]   PHYSICIAN:  Ulyses Amor, MD DATE OF BIRTH:  12/25/1960   DATE OF ADMISSION:  11/28/2006  DATE OF DISCHARGE:                              HISTORY & PHYSICAL   [name] is a 50 year old white woman who is admitted to [facility] for further evaluation of chest pain.   The patient has a history of coronary artery disease which dates back to  November of 2007.  At that time, she underwent coronary artery bypass  surgery.  Her cardiac course has been uncomplicated since then.  She has  not experienced recurrent chest pain nor has she experienced congestive  heart failure or arrhythmia.   The patient presented to the emergency department after experiencing an  episode of chest pain this evening.  It occurred while she was driving.  The chest pain was described as a sharp and then pressure discomfort in  a focal area located at the left parasternal border in the second  intracostal space.  It was associated with an ache in her intrascapular  area.  There was dyspnea, diaphoresis and nausea.  There were no  exacerbating or ameliorating factors.  The chest and intrascapular  discomfort appeared not to be related to position, activity, meals or  respirations.  It lasted a total of 20 minutes and resolved  spontaneously.  The patient is free of chest pain and is otherwise  asymptomatic at this time.  The patient notes that she has felt fatigue  for most of the day, even prior to the episode of chest pain.  She also  notes that the intrascapular discomfort is similar to that which  preceded her coronary artery bypass surgery.   The patient's risk factors for coronary artery disease include diabetes  mellitus and dyslipidemia.  She smoked cigarettes up until the time of  her coronary artery  bypass surgery.  There was no history of  hypertension or family history of coronary artery disease.   The patient has no other significant medical problems.   MEDICATIONS:  1. Gabapentin 600 mg p.o. q.i.d.  2. Lipitor 40 mg p.o. daily.  3. Metoprolol dose unknown.  4. Lisinopril 20 mg p.o. daily.   ALLERGIES:  None.   OPERATIONS:  1. Hysterectomy.  2. Cholecystectomy.   SOCIAL HISTORY:  The patient is married.  She has 2 children who are out  of the house.  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  She does not drink alcohol.   FAMILY HISTORY:  The patient's mother is alive at age 80.  She has  suffered a stroke.  The patient's father is alive at age 1 and well.  The patient has 5 siblings who are well.   REVIEW OF SYSTEMS:  Reveals no new problems related to her head, eyes,  ears, nose, mouth, throat, lungs, gastrointestinal system, genitourinary  system or extremities.  There is no history of  neurologic or psychiatric  disorder.  There is no history of fever, chills or weight loss.   PHYSICAL EXAMINATION:  Blood pressure 102/62, pulse 68 and regular,  respirations 16, temperature 97.4.  The patient was a middle-aged white  woman in no discomfort.  She was alert, oriented, appropriate and  responsive.  Head, eyes, nose and mouth were normal.  The neck was  without thyromegaly or adenopathy.  Carotid pulses were palpable  bilaterally and without bruits.  Cardiac examination revealed a normal  S1-S2.  There was no S3, S4, murmur, rub or click.  Cardiac rhythm was  regular.  No chest wall tenderness was noted.  The lungs were clear.  The abdomen was soft and nontender.  There was no mass,  hepatosplenomegaly, bruit, distention, rebound, guarding or rigidity.  Bowel sounds were normal.  Breasts, pelvic and rectal examinations were  not performed as they were not pertinent to the reason for acute care  hospitalization.  The extremities were without  edema, deviation or  deformity.  Radial and dorsalis pedis pulses were palpable bilaterally.  Brief screening neurologic survey was unremarkable.   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  The possibility of a prior anterior myocardial infarction  could not be excluded.  The rhythm was sinus.  The chest radiograph  report was not yet available at the time of this dictation.  The initial  set of cardiac markers revealed a myoglobin of 35.6, CK-MB less than  1.0, troponin less than 0.05.  White count was 9.6 with a hemoglobin of  13.4 and hematocrit of 39.5.  Potassium was 4.3 with a BUN of 18 and  creatinine 0.7.  The remaining studies were pending at the time of this  dictation.   IMPRESSION:  1. Chest pain:  Rule out unstable angina.  2. Coronary artery disease, status post coronary artery bypass surgery      in November 2007.  3. Insulin-dependent diabetes mellitus.  4. Dyslipidemia.   PLAN:  1. Telemetry.  2. Serial cardiac enzymes.  3. Aspirin.  4. Intravenous heparin.  5. Intravenous nitroglycerin.  6. Chest CT to rule out aortic dissection due to the intrascapular      pain component.  7. Further measures per Dr. Elease Hashimoto.      Ulyses Amor, MD  Electronically Signed     MSC/MEDQ  D:  11/28/2006  T:  11/28/2006  Job:  [id number]   cc:   Vesta Mixer, M.D.

## 2011-02-23 NOTE — Consult Note (Signed)
[facility]  Patient:    [name], [name] Visit Number: [id number] MRN: [id number]          Service Type: PMG Location: [facility] Attending Physician:  Sondra Come Dictated by:   Sondra Come, D.O. Proc. Date: 09/24/01 Admit Date:  09/23/2001   CC:         Kern Reap, M.D.   Consultation Report  REFERRING PHYSICIAN:  Kern Reap, M.D.  Dear Dr. Barbee Shropshire:  Thank you very much for kindly referring Ms. Tricha Ruggirello to [facility].  Patient was seen in our clinic today. Please refer to the following for details regarding the history and physical examination and treatment plan.  Once again, thank you for allowing [place] to participate in the care of Ms. Hollice Espy.  CHIEF COMPLAINT:  Bilateral leg pain.  HISTORY OF PRESENT ILLNESS:  Ms. Striplin is a pleasant 50 year old right hand dominant female who presents [facility] the [facility] complaining of a several month history of fluctuating burning pain in her lower extremities involving the anterolateral thighs and posterolateral calves to her feet.  The patient also complains of occasional paraesthesias and numbness in bilateral feet.  She has an approximate seven year history of diabetes mellitus.  Her pain today is a 6/10 on a subjective scale.  Her function and quality of life indexes have declined.  Her sleep is fair.  Her pain is described as throbbing, especially burning, and stabbing.  Her symptoms are worse with sitting and improved with heat and medications which include Neurontin 600 mg t.i.d. as well as newly added hydrocodone 5/500 mg q.i.d.  She also takes Celebrex 200 mg daily.  She denies any back pain. Denies any bowel or bladder dysfunction.  Denies any lower extremity weakness. Patient also complains of pain in the posterior greater trochanteric region which worsens with walking and prevents her from sleeping on her left  side.  I review health and history form and 14 point review of systems.  It is noted that patient has a 10-20 pound weight loss over the past six months with fluctuating weight.  She denies any constitutional symptoms such as fevers, chills, night sweats, or severe night pain.  She had a recent mammogram which she states was normal.  She denies any rectal bleeding or chronic bronchitis or hemoptysis.  PAST MEDICAL HISTORY:  Diabetes.  PAST SURGICAL HISTORY:  Hysterectomy.  PAST HOSPITALIZATIONS:  June and October 2002 for diabetic ketoacidosis.  FAMILY HISTORY:  Heart disease and diabetes.  SOCIAL HISTORY:  Patient smokes one-half pack of cigarettes today and I counseled her on the importance of smoking cessation in context of pain management and overall health.  She occasionally drinks alcohol.  She is married.  She denies drug use.  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  Her pain has put her out of work for several weeks this year, she states.  ALLERGIES:  No known drug allergies.  MEDICATIONS: 1. Celebrex 200 mg daily. 2. Zoloft 100 mg daily. 3. Amaryl 4 mg daily. 4. Neurontin 600 mg t.i.d. 5. Prevacid 30 mg daily. 6. Hydrocodone 5/500 mg q.i.d. 7. Elavil 25 mg q.h.s. 8. Lantus and Humalog.  PHYSICAL EXAMINATION  GENERAL:  Thin female in no acute distress.  She is pleasant.  VITAL SIGNS:  Blood pressure 134/81, pulse 94, respirations 18, O2 saturation 96% on room air.  BACK:  Decreased lumbar lordosis  with level pelvis and no scoliosis.  Range of motion of the lumbar spine is full in all planes without discomfort. Palpatory examination is nontender to palpation bilateral lumbar paraspinals and bilateral gluteal muscles.  There is noted significant tenderness to palpation at the left greater trochanteric bursal region.  This reproduces patients left hip pain.  NEUROLOGIC:  Manual muscle testing is 5/5 bilateral  lower extremities in all muscle groups tested.  Sensory examination reveals paraesthesia type feeling in the anterolateral thighs as well as the dorsum of the feet bilaterally. Muscle stretch reflexes are 2+/4 bilateral patellar and medial hamstrings, 0/4 bilateral Achilles.  Toes are downgoing bilaterally.  No ankle clonus noted. Dorsalis pedis pulses are present bilaterally and symmetric.  Range of motion of the hips is full bilaterally without significant discomfort.  Claiborne Rigg is positive on the left.  Straight leg raise is negative bilaterally.  Pearlean Brownie causes left trochanteric pain, otherwise negative bilaterally.  Palpation over the lateral femoral cutaneous nerves at the anterior superior iliac spine reproduces patients anterolateral thigh pain.  There is no heat, erythema, or edema in the lower extremities bilaterally.  IMPRESSION: 1. Bilateral anterolateral thigh pain.  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  Cannot conclusively rule out bilateral femoral neuropathies clinically, although patient does not have any weakness in the distribution of the femoral nerve.  In addition, patients symptoms also involve the posterolateral calves.  I would suspect patient to have symptoms in the distribution of the saphenous nerve if she had a femoral neuropathy or diabetic amyotrophy. 2. 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 3. Left trochanteric bursitis.  PLAN: 1. Had a long discussion with patient regarding diagnoses and treatment    options including medication management, minimally invasive injection    procedures to assist with her pain management. 2. Left trochanteric bursal steroid injection.  Procedure was described to    patient including risks, benefits, limitations, and alternatives.  Informed    consent was obtained.  Skin was prepped in usual sterile fashion.  Left    trochanteric bursa was  identified, injected with 1 cc of dexamethasone 4     mg/cc plus 2 cc of 1% lidocaine using a 25 gauge needle.  Patient tolerated    the procedure well.  No complications.  Discharge instructions given.    Patient counseled on 48-72 hours of post injection relative rest. 3. Increase Neurontin by 600 mg daily to a total of 2400 mg daily.  Further    titration of this will depend on patients response and tolerance to the    medication. 4. Continue hydrocodone 5/500 mg q.i.d. for now. 5. Patient to return to clinic for a trial of bilateral lateral femoral    cutaneous nerve injections.  These may not only give some pain relief, but    also may allow [place] to decrease medication usage. 6. Will perform electrodiagnostic studies in the lower extremities to evaluate    for peripheral polyneuropathy.  This will help characterize specific type    of neuropathy. 7. The patient will return to clinic in one to two weeks.  Patient was educated on the above findings and recommendations and understands.  There were no barriers to communication.  Patient was examined in a chaperoned environment. Dictated by:   Sondra Come, D.O. Attending Physician:  Sondra Come DD:  09/24/01 TD:  09/25/01 Job: [id number]

## 2011-02-23 NOTE — H&P (Signed)
[name], [name] NO.:  [id number]   MEDICAL RECORD NO.:  [id number]          PATIENT TYPE:  INP   LOCATION:  1829                         FACILITY:  [facility]   PHYSICIAN:  Lonia Blood, M.D.       DATE OF BIRTH:  [birth year]   DATE OF ADMISSION:  08/30/2006  DATE OF DISCHARGE:                                HISTORY & PHYSICAL   PRIMARY CARE PHYSICIAN:  Dr. Leonard Downing.   CHIEF COMPLAINT:  Nausea, vomiting, abdominal pain.   HISTORY OF PRESENT ILLNESS:  [name] is a 50 year old Caucasian woman  with a history of diabetes mellitus, coronary artery disease status post  coronary artery bypass grafting 2 weeks ago, who presents to [facility]  emergency room with sudden onset of severe nausea, vomiting, and right upper  quadrant abdominal pain.  She said that today she vomited so many times  until the vomit changed color from clear to yellow to green.  The patient is  also status post cholecystectomy 2 years ago.  The patient reports that her  right upper quadrant abdominal pain was so severe, 10/10, with radiation  into the back.  She also reports that her urine has changed color, has  become more concentrated and with a foul smell.   PAST MEDICAL HISTORY:  1. Diabetes mellitus.  2. Coronary artery disease.  3. Total hysterectomy.  4. Cholecystectomy 2 years ago.  5. 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   HOME MEDICATIONS:  1. Neurontin 600 mg four times a day.  2. Vicodin four times a day.  3. Lipitor 40 mg daily.  4. Lopressor 25 mg daily.  5. Lisinopril 20 mg daily.  6. Metformin 500 mg twice a day.  7. Insulin NPH 50 units in the morning and 35 units at bedtime.  8. Humalog sliding scale according to the results of the CBG.   SOCIAL HISTORY:  The patient is married. She has two grown-up children.  She  works as a Aeronautical engineer.  She used to smoke cigarettes right  until the time she went for coronary artery bypass grafting.  The  patient  does not drink alcohol.   FAMILY HISTORY:  The patient's mom is alive at age 30 with history of a  minor stroke.  The patient's father is alive at age 36 without any major  medical problems.  The patient has two brothers and three sisters that are  alive and healthy.   REVIEW OF SYSTEMS:  As per HPI.  The patient denies chest pain.  Denies any  headaches.  Denies focal weakness.  The patient reports some lower extremity  numbness which is chronic in nature. All other systems are negative.   PHYSICAL EXAMINATION ON ADMISSION:  Shows a temperature 97, blood pressure  156/88, pulse 79, respirations 74, saturation 98% on room air.  GENERAL APPEARANCE:  This is an anxious, ill-appearing 50 year old woman in  some minor distress, alert, oriented place, person and time.  HEAD:  Is normocephalic, atraumatic.  Eyes: Pupils equal, round, reactive to  light and accommodation.  Extraocular movements intact.  Conjunctivae  are  pink.  Sclerae anicteric.  Throat is clear.  NECK:  Supple.  No JVD.  No carotid bruits.  CHEST: The patient has rhonchi in the left lower base.  No wheezes, no  crackles.  HEART:  Is regular rate and rhythm without murmurs, rubs or gallops.  ABDOMEN:  Is soft.  Bowel sounds are decreased.  There is some tenderness in  the right upper quadrant.  No rebound, no guarding.  In the right lower  quadrant, there is no appreciable tenderness.  No rebound and no guarding.  LOWER EXTREMITIES: Have no edema.  SKIN:  Is warm and dry without any suspicious rashes.  NEUROLOGICAL EXAM:  Cranial nerves III-XII are intact.  Sensation is  decreased in a stocking-like distribution in the lower extremities.  Strength is 5/5 all 4 extremities.   LABORATORY VALUES AT TIME OF ADMISSION:  Sodium is 136, potassium 4.1,  chloride 106, BUN 12, glucose 365, pH 7.44, pCO2 33, bicarb 22, white blood  cell count 17,000, hemoglobin 10, platelet count 605,000, albumin 3.5, AST  22, ALT 30,  creatinine 0.7, total bilirubin 0.6.  Urinalysis mildly positive  for ketones, positive for glucose, negative for leukocyte esterase, positive  for bacteria.  Urine drug screen is positive for opiates only.  An anion gap  is 8.   Chest x-ray shows residual left lower lobe airspace disease.  The BNP is  1200.  A CT scan of the abdomen shows interstitial edema in the lung bases,  hepatic venous congestion, diffuse edema in the small bowel and mesentery,  small scar at the right kidney.  There is also edema in the appendix.   ASSESSMENT/PLAN:  1. Nausea, vomiting, abdominal pain of unclear etiology in this      complicated patient with diabetes and recent coronary artery bypass      graft. I would consider, at this point in time, possibility of acute      gastroenteritis versus the possibility of an incipient pyelonephritis      versus, less likely, appendicitis.  My plan right now is to observe      this patient in the hospital on symptomatic treatment and empiric      Rocephin.  2. Diabetes mellitus, poorly controlled.  Will keep this patient on Lantus      and sliding scale insulin and observe her closely as to prevent      developing of ketoacidosis.  3. Recent flash pulmonary edema with current volume overload. I will      gently diurese this patient with Lasix intravenously as the blood      pressure tolerates.  4. Mild postoperative anemia.  At this point in time, there are no signs      of an acute bleed or any signs of hemodynamic instability.  Will keep      checking the CBC and a daily basis to assure the hemoglobin remains      stable.      Lonia Blood, M.D.  Electronically Signed     SL/MEDQ  D:  08/30/2006  T:  08/30/2006  Job:  [id number]

## 2011-02-23 NOTE — Op Note (Signed)
NAMEFLORABEL, [name]             ACCOUNT NO.:  [id number]   MEDICAL RECORD NO.:  [id number]          PATIENT TYPE:  INP   LOCATION:  2314                         FACILITY:  [facility]   PHYSICIAN:  Salvatore Decent. Cornelius Moras, M.D. DATE OF BIRTH:  March 25, 1961   DATE OF PROCEDURE:  08/15/2006  DATE OF DISCHARGE:                                 OPERATIVE REPORT   PREOPERATIVE DIAGNOSIS:  Severe three-vessel coronary artery disease with  severe left ventricular dysfunction, class IV congestive heart failure.   POSTOPERATIVE DIAGNOSIS:  Severe three-vessel coronary artery disease with  severe left ventricular dysfunction, class IV congestive heart failure.   PROCEDURE:  Median sternotomy for coronary artery bypass grafting x4 (left  internal mammary artery to distal left anterior descending coronary artery,  saphenous vein graft to first diagonal branch, saphenous vein grafts to  ramus intermediate branch, saphenous vein graft to distal right coronary  artery, endoscopic saphenous vein harvest from right thigh and right lower  leg).   SURGEON:  Salvatore Decent. Cornelius Moras, MD   ASSISTANT:  Charlesetta Garibaldi, PA   ANESTHESIA:  General.   BRIEF CLINICAL NOTE:  The patient is a 50 year old female with type 1  diabetes mellitus, who presents with class IV congestive heart failure.  She  ruled out for acute myocardial infarction, but clinically history IS  suggestive of recent out-of-hospital myocardial infarction prior to  admission.  The patient underwent 2-D echocardiogram and cardiac  catheterization by Dr. Delane Ginger.  She was found to have severe left  ventricular dysfunction with severe three-vessel coronary artery disease.  Initially, there was some question as to whether or not she may have an  atrioseptal defect, but transesophageal echocardiogram demonstrates no such  finding.  A full consultation note has been dictated previously.  The  patient and her family have been counseled at length regarding  the  indications, risks, and potential benefits of coronary artery bypass  grafting.  Alternative treatment strategies have been discussed in detail.  They understand and accept all associated risks of surgery and desire to  proceed as described.  They also has been counseled at length regarding the  critical need for the patient to quit smoking and to find a way to bring her  diabetes under tight control for the long-term future after subsequent  recovery from her cardiac surgery.  All of their questions have been  addressed.   OPERATIVE FINDINGS:  1. Severe left ventricular dysfunction with ejection fraction estimated at      25%.  2. The appearance of viable myocardium and all vascular territories      suggestive of the possibility that left ventricular function might      improve following revascularization.  3. Good-quality left internal mammary artery and saphenous vein conduit.  4. Good-quality target vessels for grafting.   OPERATIVE NOTE IN DETAIL:  The patient is brought to the operating room on  the above-mentioned date and central monitoring is established by the  anesthesia service under the care and direction of Dr. Judie Petit.  Specifically, a Swan-Ganz catheter is placed through the right internal  jugular approach.  A radial arterial line is placed.  The intravenous  antibiotics are administered.  Following induction with general endotracheal  anesthesia, a Foley catheter is placed.  The patient's chest, abdomen, both  groins, and both lower extremities are prepared and draped in a sterile  manner.   Baseline transesophageal echocardiogram is performed by Dr. Randa Evens.  This  confirms the presence of severe left ventricular dysfunction.  There is mild  mitral regurgitation.  No other significant abnormalities are noted.   A median sternotomy incision is performed and the left internal mammary  artery is dissected from the chest wall and prepared for bypass  grafting.  The left internal mammary artery is notably a good-quality conduit.  Simultaneously, saphenous vein is obtained the patient's right thigh and the  upper portion of the right lower leg using endoscopic vein harvest  technique.  The saphenous vein is notably a good-quality conduit.  After the  saphenous vein is removed, the small surgical incisions in the right lower  extremity are closed in multiple layers with running absorbable suture.  The  patient is heparinized systemically.  The left internal mammary artery is  transected distally and is noted to have excellent flow.   The pericardium was opened.  The ascending aorta is normal in appearance.  The ascending aorta and the right atrium are cannulated for cardiopulmonary  bypass.  Adequate heparinization is verified.  Cardiopulmonary bypass is  begun and the surface of the heart is inspected.  Distal sites are selected  for coronary bypass grafting.  A temperature probe is placed in the left  ventricular septum.  A cardioplegic catheter is placed in the ascending  aorta.   The patient is allowed to cool passively to 32 degrees systemic temperature.  The aortic crossclamp is applied and cold blood cardioplegia is administered  in an antegrade fashion through the aortic root.  Iced saline slush is  applied for topical hypothermia.  The initial cardioplegic arrest and  myocardial cooling are felt to be excellent.  Repeat doses of cardioplegia  are administered intermittently throughout the crossclamp portion of the  operation through the aortic root and down the subsequently placed vein  grafts to maintain left ventricular septal temperature below 15 degrees  centigrade.   The following distal coronary anastomoses are performed:  (1) The distal  right coronary artery is grafted with a saphenous vein graft in an end-to-  side fashion.  This vessel measures 1.8 mm in diameter and is of good quality at the site of distal bypass.   (2)  The ramus intermediate branch is  grafted with a saphenous vein graft in an end-to-side fashion.  This vessel  measures 2.0 mm in diameter and is a good-quality target vessel for  grafting.  (3)  The first diagonal branch off the left anterior descending  coronary artery is grafted with a saphenous vein graft in an end-to-side  fashion.  This vessel measures only 1.0 mm in diameter, but is a good-  quality target, other than the fact that is quite small.  (4)  The distal  left anterior descending coronary artery is grafted with the left internal  mammary artery in an end-to-side fashion.  This vessel measures 1.4 mm in  diameter.  This vessel is also a good-quality conduit, but notably quite  small.  A 1.5 probe will pass backwards up the proximal left anterior  descending coronary artery, but only a 1.0 probe will pass distally.   All 3 proximal saphenous vein  anastomoses are performed directly to the  ascending aorta prior to removal of the aortic crossclamp.  The left  ventricular septal temperature rises rapidly following reperfusion of the  left internal mammary artery.  The aortic crossclamp is removed after a  total crossclamp time of 67 minutes.  The heart begins to beat spontaneously  without need for cardioversion.  All proximal and distal coronary  anastomoses are inspected for hemostasis and appropriate graft orientation.  The patient is rewarmed to 37 degrees centigrade temperature.  Low-dose  milrinone infusion is begun.  The patient is weaned from cardiopulmonary  bypass without difficulty.  The patient's rhythm at separation from bypass  is normal sinus rhythm.  The patient is weaned from bypass on low-dose  milrinone infusion.  Total cardiopulmonary bypass time for the operation is  81 minutes.  Followup transesophageal echocardiogram performed by Dr.  Randa Evens confirms no significant differences with perhaps slight improvement  in the left ventricular function.    The venous and arterial cannulae are removed uneventfully.  Protamine is  administered to reverse the anticoagulation.  The patient did develop  transient episode of elevated pulmonary artery pressures after infusion of  protamine.  Simultaneous transesophageal echocardiogram confirms no changes  in left ventricular function, no significant mitral regurgitation and no  other abnormalities to corroborate the change in pulmonary artery pressures.  This resolved spontaneously and is felt that it may be related to protamine  infusion.  Prior to completion of the operation, the patient has PA  pressures are low normal and the patient continues to have excellent cardiac  output.   The mediastinum and the left chest are irrigated with saline solution  containing vancomycin.  Meticulous surgical hemostasis is ascertained.  The  mediastinum and both pleural spaces are drained with 4 chest tubes exited through separate stab incisions inferiorly.  The soft tissues anterior to  the aorta are reapproximated loosely.  The sternum is closed with single-  strength sternal wire.  The soft tissues anterior to sternum are closed in  multiple layers and the skin is closed with a running subcuticular skin  closure.   The patient tolerated the procedure well and is transported to the surgical  intensive care unit in stable condition.  There were no intraoperative  complications.  All sponge, instrument and needle counts are verified  correct at completion of the operation.  No blood products were  administered.      Salvatore Decent. Cornelius Moras, M.D.  Electronically Signed     CHO/MEDQ  D:  08/15/2006  T:  08/16/2006  Job:  [id number]   cc:   Vesta Mixer, M.D.  Olene Craven, M.D.

## 2011-02-23 NOTE — Consult Note (Signed)
[facility]  Patient:    [name], COCHRANE Visit Number: [id number] MRN: [id number]          Service Type: PMG Location: [facility] Attending Physician:  Sondra Come Dictated by:   Sondra Come, D.O. Proc. Date: 10/06/01 Admit Date:  09/23/2001                            Consultation Report  REASON FOR FOLLOWUP:  Ms. Saur returns to clinic today as scheduled for trial of bilateral lateral femoral cutaneous nerve blocks secondary to bilateral meralgia paresthetica.  Patient was initially seen on September 24, 2001.  At that time, I injected her left trochanteric bursa secondary to bursitis.  Patient states she has had considerably relief of her left hip pain.  She continues to have dysesthetic pain in her anterolateral thighs bilaterally.  Otherwise, symptoms have not changed significantly.  I review health and history form and 14-point review of systems.  Patients pain is a 5/10 on a subjective scale.  Her function and quality-of-life indices remain stable.  Her sleep is good.  She continues to take hydrocodone 5 mg four times daily and Neurontin 600 mg four times daily with some relief.  PHYSICAL EXAMINATION:  GENERAL:  Physical examination reveals a healthy female in no acute distress.  VITAL SIGNS:  Blood pressure was 143/83, pulse 92, respirations 18, O2 saturation 95% on room air.  NEUROLOGIC:  No new neurologic findings in the lower extremities including motor, sensory and reflexes.  IMPRESSION: 1. Bilateral meralgia paresthetica. 2. Left trochanteric bursitis, significantly improved. 3. 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  PLAN: 1. Bilateral lateral femoral cutaneous nerve blocks.  Procedure was described    to the patient including risks, benefits, limitations and alternatives.    Patient wishes to proceed.  Informed consent was obtained.  Skin was    prepped in usual sterile fashion.  Using a 25-gauge 1-1/2-inch  needle    inserted 1 inch medially to the anterosuperior iliac spine and inferior to    the inguinal ligament, I advanced the needle approximately 1 inch and    performed negative aspiration.  I then injected 0.5 cc of Kenalog 40 mg/cc    plus 3.5 cc of 1% lidocaine bilaterally using independent needle access    points.  Patient tolerated the procedure well.  Post injection, patient    notes decreased light touch sensation in the distribution of the lateral    femoral cutaneous nerve without any dysesthetic pain. 2. Continue current medications.  We may actually be able to decrease her    narcotic usage after the injections. 3. Patient to return to clinic in two weeks for reevaluation and possible    repeat injections. 4. We will have patient return following that for peripheral nerve conduction    studies and electromyography to determine the extent of peripheral    polyneuropathy in the lower extremities  Patient was educated in the above findings and recommendations and understands.  There were no barriers to communication.Dictated by:   Sondra Come, D.O. Attending Physician:  Sondra Come DD:  10/06/01 TD:  10/06/01 Job: [id number]

## 2011-02-23 NOTE — H&P (Signed)
[name], [name] NO.:  [id number]   MEDICAL RECORD NO.:  [id number]          PATIENT TYPE:  INP   LOCATION:  5530                         FACILITY:  [facility]   PHYSICIAN:  Lonia Blood, M.D.       DATE OF BIRTH:  [birth year]   DATE OF ADMISSION:  09/03/2006  DATE OF DISCHARGE:                              HISTORY & PHYSICAL   The patient's primary care physician is Dr. Kern Reap.   CHIEF COMPLAINTS:  Nausea, vomiting, and abdominal pain.   HISTORY OF PRESENT ILLNESS:  [name] is a 49 year old Caucasian woman  with history of diabetes mellitus, coronary artery disease who was  hospitalized from August 30, 2006 from September 02, 2006 for nausea,  vomiting, and abdominal pain, who presents to [facility] Emergency Room  today for recurrent nausea, vomiting, and right upper quadrant abdominal  pain.  Apparently, the patient symptoms have resolved somehow during her  previous hospitalization, but as soon as she went home and she ate solid  foods, the pain came back with a vengeance associated with bilious  vomiting.   For past medical history, medications, social history, family history,  please refer to the previously dictated hitory and physical from 3 days  ago.   For review of systems, the patient denies any chest pain.  Denies any  shortness of breath.  Denies any diarrhea.  The patient reports some  mild constipation.  All other systems are negative.   PHYSICAL EXAMINATION:  Upon admission shows a temperature of 98.4, blood  pressure 152/87, pulse 86, respirations 18, saturation 95% on room air.  GENERAL APPEARANCE:  A well-developed, well-nourished Caucasian woman in  some mild distress; alert and oriented to place, person and time.  HEAD:  Is normocephalic, atraumatic.  EYES:  Pupils are equal, round,  reactive to light accommodation.  Extraocular was intact.  Conjunctivae  are pink.  Sclerae anicteric.  THROAT:  Clear.  NECK:  Supple.  No JVD.   No carotid bruits.  CHEST:  Clear to auscultation bilaterally without wheezes, rhonchi or  crackles.  HEART:  Regular rate and rhythm without murmurs, rubs or gallops.  The patient's abdomen is soft.  There is minor tenderness on deep  palpation in the right upper quadrant, but there is no rebound, no  guarding.  No palpable hepatosplenomegaly.  LOWER EXTREMITIES:  Have no edema.  SKIN:  Is warm and dry without any suspicious rashes.  NEUROLOGICAL EXAM:  Cranial nerves III-XII are intact.  Sensation intact  in all 4 extremities.   LABORATORY VALUES:  At the time of admission, white blood cell count  9.2, hemoglobin 11.1, platelet count is 567.  Creatinine 0.8, sodium  140, potassium 3.2, chloride 107, BUN 6, creatinine 0.8; AST 18, ALT 18,  alkaline phosphatase 146, bilirubin is 0.7, lipase is 14.  Abdominal x-  ray shows no perforation, no free air, no ileus, no small-bowel  obstruction, moderate amount of stool.  Portable chest x-ray shows  minimal atelectasis in the left lower base.   ASSESSMENT/PLAN:  1. Recurrent nausea, vomiting, and abdominal pain.  At this  point in      time, I am still puzzled about the exact etiology, considering the      possibility of a duodenitis versus duodenal ulcer versus gastritis      versus possibility of a common bile duct stone.  The patient will      be seen in consultation by Dr. Elnoria Howard from gastroenterology, and      further workup will be done according to his plans.  For now, the      patient will receive intravenous fluids and symptomatic treatment.      She will be admitted to [facility].  2. Diabetes mellitus type 2.  This is fairly well-controlled in the      outpatient setting.  Will continue the NPH and sliding scale      insulin here in the hospital.  3. Recent coronary artery bypass grafting.  The patient seems to be      healing well from the surgery, and there are no pressing      complications or issues to be addressed  right now.      Lonia Blood, M.D.  Electronically Signed     SL/MEDQ  D:  09/03/2006  T:  09/04/2006  Job:  [id number]

## 2011-02-23 NOTE — H&P (Signed)
[facility]  Patient:    [name], [name]                      MRN: [id number] Adm. Date:  [id number] Attending:  Miguel Aschoff                         History and Physical  CHIEF COMPLAINT:  "I am sick on my stomach."  HISTORY OF PRESENT ILLNESS:  The patient is a 50 year old insulin-dependent diabetic who is a patient of Dr. Record in [place].  She awoke with vomiting at 0200 today and then repeated at 0400 and continued through the day.  She was doing very poorly towards the end of the day and kept nothing down.  They called EMS late afternoon and they arrived in the ED around 1800. She has PMH with TVH for heavy bleeding and the adult onset insulin-dependent diabetes approximately six years ago.  No admissions for diabetes in the past. She has anxiety and depression.  CURRENT MEDICATIONS: 1. Humalog R 15 units a.m. and 10 units p.m. 2. N 30 units a.m. and 18-20 units p.m. 3. Avandia 4 mg q.d. 4. Zoloft 100 mg q.d. 5. Doxepin 1-2 10 mg tablets at night, but she has not taken them because of    this hangover effect, for itching and sleep.  ALLERGIES:  No known drug allergies.  SOCIAL HISTORY:  She is married and smokes about 1/2 pack-per-day.  About one drink per week.  Works as a Occupational psychologist.  She has four children and four grandchildren.  FAMILY HISTORY:  Her mother has just developed diabetes and maternal and paternal grandfather and grandmother all have heart disease.  Children are alive and well.  REVIEW OF SYSTEMS:  She says she has been in reasonable health, but losing weight gradually over the six to eight months, perhaps 20 pounds total.  She lost weight about a month ago with an illness.  Her sugars typically run 150-300; last night 280.  She complains of leg pain especially at night and sort of restless leg movements.  Bowel or bladder are typically okay, but she developed some diarrhea later today.  SKIN:   Poorly healing and occasionally burning in her a  feet.  She has a problem on her back with sores that she is seeing a dermatologist for.  She had strep with a rash in February 2002, but recovered from that.  She had fever blisters starting yesterday and then complains of increased stress with work, caring for grandchildren, etc., recently.  She had chills today, but does not routinely.  She occasionally has some sweats at night, no fever.  She has not eaten any food that she would consider bad recently.  Her eye exam by an ophthalmologist in April 2002 was normal.  Pap smears and mammograms have been normal as well.  PHYSICAL EXAMINATION:  VITAL SIGNS:  Initial blood pressure 98/74, 120s/80s when I see her.  Pulse 112, still running in 120 range or so.  Respirations 24 and temperature 97.1.  GENERAL:  She is slightly pale, but well tan at baseline with ketotic odor and slightly heavy breathing.  She wretches intermittently.  HEENT:  Normocephalic, atraumatic.  TMs and oropharynx are clear.  PERRL. Full extraocular movements.  NECK:  Without JVD, thyromegaly, mass, or node.  2+ carotid pulses  HEART:  Regular rate and rhythm without murmur, rub or gallop.  LUNGS:  Clear to  auscultation.  ABDOMEN:  No bowel sounds at present.  She is tender diffusely in the upper abdominal, most in the mid abdomen.  No organomegaly or mass.  GU/BREAST/RECTAL:  Deferred.  EXTREMITIES:  2+ pulses in the radial and DP arteries.  A few scratches that have not healed well.  No clubbing, cyanosis, or edema.  DERMATOLOGIC:  She has numerous sores on her back that have been excoriated and most have begun to scar.  NEUROLOGIC:  Cranial nerves II-XII grossly intact.  Light touch in the feet are intact and she moves all four symmetrical.  LABORATORY:  Her initial BMP showed a sodium 137, potassium 4.5, CO2 7, BUN 1.3, glucose 590.  CBC:  WBC 21.2, 89% neutrophils.  ABG on room air 7.16, pCO2 18, pO2  125, bicarbonate 6, and 98% oxygen saturation.  CBGs have come down progressively:  461 at one hour, 398 at 1-1/2 hours, and 322 at 2-1/2 hours.  She is on the Glucommander to guide the insulin drip.  X-RAY:  X-ray results pending at present.  IMPRESSION/PLAN:  Diabetic ketoacidosis in an adult insulin-dependent diabetic.  Plan is to continue vigorous IV hydration, IV insulin, and evaluate any possible sources of infection.  Blood cultures have been drawn and IV antibiotics given already.  For the VGE, most likely etiology of the vomiting and then triggering the diabetic ketoacidosis.  She has no organ-specific pain on x-ray, probably will be nonspecific ileus-type, but if there is specific pathology, I will follow that appropriately.  Leukocytosis probably secondary to the primary illnesses, but as mentioned blood cultures have been drawn, IV antibiotics administered, and that will be rechecked in the morning.  Her blood pressure is stable and there is no fever, do not feel that sepsis is a present concern and she is responding rapidly, is conversant, and is feeling better significantly than when she first arrived. DD:  03/02/01 TD:  03/03/01 Job: [id number]

## 2011-02-23 NOTE — Discharge Summary (Signed)
Anna, Armstrong NO.:  [id number]   MEDICAL RECORD NO.:  [id number]          PATIENT TYPE:  INP   LOCATION:  3743                         FACILITY:  [facility]   PHYSICIAN:  Madaline Savage, MD        DATE OF BIRTH:  09-28-1961   DATE OF ADMISSION:  08/30/2006  DATE OF DISCHARGE:  09/02/2006                               DISCHARGE SUMMARY   PRIMARY CARE PHYSICIAN:  Kern Reap, M.D.   DISCHARGE DIAGNOSES:  1. Acute gastroenteritis which is resolved.  2. Coronary artery disease with recent coronary artery bypass graft.  3. Urinary tract infection.  4. Diabetes mellitus.  5. Lung nodule which needs follow-up CAT scan in 3 months.  6. Hypokalemia which is resolved.   HISTORY OF PRESENT ILLNESS:  For full H&P, see the H&P dictated by Dr.  Lonia Blood on August 30, 2006.  In short, [name] is a 50 year old  lady who has a history of diabetes mellitus, coronary artery disease who  had coronary artery bypass graft 2 weeks ago, who presented with nausea,  vomiting, and abdominal pain.  She said her pain was in her right upper  quadrant and epigastrium.  She had workup here in the hospital which did  not reveal anything, she was treated with Phenergan, and her symptoms  have resolved since then.   PROCEDURES DONE IN THE HOSPITAL:  1. She had an acute abdominal series done which showed nonspecific      bowel gas spectrum with moderate amount of stool.  2. She had a CT of the abdomen without contrast which showed small      amount of intraperitoneal free fluid with diffuse tissue edema.      The appendix is visualized and there is some edema around the      appendix, although it is not dilated and demonstrates no wall      thickening.  The CT abdomen also showed a 7-mm right middle lobe      pulmonary nodule and they recommended to follow up a CT of the      chest in 6 to 12 months if the patient does not have a history of      smoking and 3 months if the patient  has a history of smoking.   PROBLEM LIST:  1. Acute onset of nausea and vomiting.  She had CT abdomen which      showed some edema around the appendix, but there was no wall      thickening.  We were also worried about other causes, ,but her      symptoms resolved the next day.  She has had a cholecystectomy in      the past and she was given some IV fluids while here.  Now her      symptoms are completely resolved and she has no more nausea, she      will be discharged home with some p.r.n. Phenergan as needed.  2. Coronary artery disease.  She has had a recent coronary artery      bypass graft and so  we did cardiac enzymes while in the hospital      which had certain mild elevation but which was stable.  Her      troponins at this point were 0.1, .09, 0.1, .09, and 0.08, which      can be explained by her recent open heart surgery.  She did not      complain of any chest pain.  She will keep her appointment with the      cardiologist.  3. Lung nodule.  While working up here in the hospital for abdominal      pain, the CT abdomen which extended to the chest showed a 7-mm lung      nodule.  She does have a strong history of smoking so we recommend      to get a followup CT of the chest to look for progression of the      nodule in 3 months.  4. Some volume overload.  When she was in the hospital she had some      crackles bilaterally.  We treated her with IV Lasix for 2 days in      the hospital.  Her crackles had gone so we are stopping her Lasix      at this time.  5. Urinary tract infection.  She did have some leukocytes in her urine      but on microscopy was negative.  She was empirically treated with      Rocephin for 3 days.  She does not have any symptoms.  6. Diabetes mellitus has been stable while in the hospital.   DISCHARGE MEDICATIONS:  1. Phenergan 25 mg every 6 hours as needed for nausea.  2. Neurontin 600 mg 4 times daily.  3. Vicodin as before.  4. Lipitor 40 mg  daily.  5. Lopressor 25 mg daily.  6. Lisinopril 20 mg daily.  7. Metformin 500 mg twice daily.  8. Insulin NPH 50 units in the morning and 34 units at bedtime.  9. Humalog sliding scale according to the results of the CBG as      before.   DISPOSITION:  She will now be discharged back to home in a stable  condition.   FOLLOWUP:  She will follow up with her regular doctors.  She will keep  the appointment with Dr. Kern Reap in about 1 to 2 weeks.  She  will also keep her regular appointment with her cardiologist.      Madaline Savage, MD  Electronically Signed     PKN/MEDQ  D:  09/02/2006  T:  09/02/2006  Job:  [id number]   cc:   Olene Craven, M.D.

## 2011-02-23 NOTE — H&P (Signed)
[name], [name] NO.:  [id number]   MEDICAL RECORD NO.:  [id number]          PATIENT TYPE:  INP   LOCATION:  2314                         FACILITY:  [facility]   PHYSICIAN:  Judie Petit, M.D. DATE OF BIRTH:  Feb 12, 1961   DATE OF ADMISSION:  08/11/2006  DATE OF DISCHARGE:                                HISTORY & PHYSICAL   HISTORY OF PRESENT ILLNESS:  The patient is a 50 year old female who  presents today for coronary artery bypass grafting by Dr. Tressie Stalker.  On  the morning of surgery, the patient was brought to the post-anesthesia care  unit where pulmonary artery and radial artery lines were placed with  sedation under local anesthesia.  The patient was subsequently taken to the  operating room where general anesthesia was carried out.  Endotracheal tube  was placed without difficulty.  The transesophageal echocardiogram probe was  heavily lubricated in a sleeve and placed down the oropharynx without  difficulty.  The transesophageal echocardiogram will be utilized to assess  left ventricular function throughout the procedure.   PREBYPASS CORONARY ARTERY BYPASS GRAFTING REPORT:  Left ventricle:  Overall  there was moderate decrease in left ventricular function.  Papillary muscles  were well outlined.  There were no masses noted within the left ventricular  chamber.   Aortic valve:  The aortic valve was trileaflet in nature.  There was no  significant aortic regurgitant flow on Doppler examination.   Mitral valve:  The mitral valve appeared to function appropriately.  There  was trace to 1+ mitral regurgitation on Doppler examination.   Left atrium:  The interatrial septum was intact.  There did not appear to be  any evidence of ASD or PFO.  Left atrial appendage was visualized.  There  were no masses noted within the left atrial chamber.   Right ventricle:  Overall right ventricle appeared within normal limits.  There was no significant tricuspid  regurgitant flow on Doppler examination.   Post bypass examination:  Left ventricle overall again left ventricular  function was moderate to severe, decreased.  There were no significant  changes from preoperative examination.  The rest of the cardiac exam was as  previously described.  The patient was subsequently taken to the surgical  intensive care unit in stable condition.  The transesophageal echocardiogram  probe was removed with the sleeve intact.  There did not appear to be any  oropharyngeal damage or problems.      Judie Petit, M.D.     CE/MEDQ  D:  08/15/2006  T:  08/16/2006  Job:  [id number]

## 2011-02-23 NOTE — Consult Note (Signed)
[name], [name]             ACCOUNT NO.:  [id number]   MEDICAL RECORD NO.:  [id number]          PATIENT TYPE:  INP   LOCATION:  5530                         FACILITY:  [facility]   PHYSICIAN:  Jordan Hawks. Elnoria Howard, MD    DATE OF BIRTH:  18-Feb-1961   DATE OF CONSULTATION:  09/03/2006  DATE OF DISCHARGE:                                 CONSULTATION   REFERRING SERVICE:  [facility].   PRIMARY CARE Virgene Tirone:  Dr. Kern Reap.   HISTORY OF PRESENT ILLNESS:  This is a 50 year old female with a past  medical history of diabetes and coronary artery disease, status post  CABG 2 weeks ago, status post cholecystectomy approximately 2 years ago  and peripheral neuropathy, who presents to the emergency room with  complaints of nausea and vomiting and abdominal pain.  Apparently, the  patient was recently admitted on August 30, 2006 with similar types of  complaints of sudden onset of severe nausea, vomiting and right upper  quadrant abdominal pain.  She was treated briefly for 3 days in the  hospital and with antiemetics, her symptoms had resolved during that  period.  There are no clear abnormalities in regards to her laboratory  values.  She was treated symptomatically and subsequently discharged  home.  Unfortunately, when she arrived home, she started to experience  the same type of symptoms again.  She reports that her nausea is a sick  type of feeling and it is worsened with p.o. intake.  Previously, when  she had her gallbladder removed, she had right upper quadrant pain as  well as nausea; however, the symptoms are not similar to this.  She does  relate back a number of years ago having the same type of symptoms, but  she believes she had a CBD stone which passed spontaneously; however,  before the passage of the stone, she did experience almost the same type  of symptoms that she has at this time.  CT scan was performed during her  most recent admission and there was no  evidence of any CBD stones,  although it was specifically commented in the CT scan report.   PAST MEDICAL HISTORY AND PAST SURGICAL HISTORY:  As stated above.   HOME MEDICATIONS:  1. Neurontin 600 mg p.o. q.i.d. \  2. Vicodin q.i.d.  3. Lipitor 40 mg daily.  4. Lopressor 25 mg p.o. daily.  5. Lisinopril 20 mg p.o. daily.  6. Metformin 500 mg b.i.d.  7. Insulin 50 units q.a.m. and 35 units nightly.  8. Humalog sliding scale.   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   FAMILY HISTORY:  Noncontributory for her current symptomatology.   REVIEW OF SYSTEMS:  As per the history of present illness, positive for  some chest soreness, no shortness of breath, dizziness, vertigo, blurry  vision, dysuria, dysarthria, hematuria, diarrhea, constipation,  arthritis, arthralgias, skin rashes or new neurologic changes.   PHYSICAL EXAMINATION:  VITAL SIGNS:  Stable.  GENERAL:  The patient is sick-appearing.  HEENT:  Normocephalic, atraumatic.  Extraocular muscles intact.  Pupils  equal, round and reactive to light.  NECK:  Supple.  No lymphadenopathy.  LUNGS:  Clear to auscultation bilaterally.  CARDIOVASCULAR:  Regular rate and rhythm.  ABDOMEN:  Flat, soft and tender in the epigastrium and right upper  quadrant.  She has positive bowel sounds.  EXTREMITIES:  No clubbing, cyanosis or edema.   LABORATORY VALUES:  On September 03, 2006, white blood cell count is 9.2,  hemoglobin 12.2, MCV is 87.2, platelets at 567,000; sodium 140,  potassium 3.2, chloride 107, glucose 114, BUN 6, creatinine 0.8, total  bilirubin 0.7, alk phos is 146, AST 18, ALT 18, albumin is 3.5, lipase  is 14.   IMPRESSION:  1. Nausea, vomiting and right upper quadrant abdominal pain.  The      etiology is uncertain at this time.  Several possibilities are      gastroesophageal reflux disease/esophagitis, as this can result in      the same type of symptoms.  There is a possibility that she  may      have retained gallstones.  Although she does have a mild elevation      in her alkaline phosphatase, there is no elevation in her bilirubin      or other transaminases.  A CT scan was performed and there is no      specific comment in regard to her biliary tract, but I believe      further evaluation with an ultrasound may be of benefit at this      time.  Gastroparesis can also be a possibility for this patient and      given her history of peripheral neuropathy secondary to her      diabetes.   PLAN:  1. To perform an EGD tomorrow.  2. Right upper quadrant ultrasound.  3. To start on a proton pump inhibitor.      Jordan Hawks Elnoria Howard, MD  Electronically Signed     PDH/MEDQ  D:  09/03/2006  T:  09/04/2006  Job:  [id number]

## 2011-02-23 NOTE — Cardiovascular Report (Signed)
NAMEJAMILETTE, SUCHOCKI NO.:  [id number]   MEDICAL RECORD NO.:  [id number]           PATIENT TYPE:   LOCATION:                                 FACILITY:   PHYSICIAN:  Vesta Mixer, M.D.      DATE OF BIRTH:   DATE OF PROCEDURE:  08/13/2006  DATE OF DISCHARGE:                              CARDIAC CATHETERIZATION   HISTORY:  [name] is a 50 year old female with A long history of  diabetes mellitus.  She has a long history of cigarette smoking.  She was  admitted over this weekend with a recent myocardial infarction.  She  presented with flash pulmonary edema.  The patient was diuresed; and was set  up today for a heart catheterization.   PROCEDURE:  Left heart catheterization with coronary angiography and left  ventriculography.   HEMODYNAMICS:  The LV pressure was 117/26 with an aortic pressure of 114/56.   ANGIOGRAPHY:  1. The left main is smooth and normal.  2. The left anterior descending artery is proximal in its normal segment.      The mid vessel has mild irregularities.  The first diagonal artery has      an 80-90% stenosis.  The distal LAD is subtotally occluded right at the      origin of the second diagonal vessel.  There is 95% stenosis in both      the distal LAD and the second diagonal branch.  3. The left circumflex artery is a large vessel and gives off a large      obtuse marginal artery.  There is a proximal 80-90% stenosis in the      first obtuse marginal artery.  4. The right coronary artery is a moderate-sized vessel.  There is a tight      95% stenosis in the mid vessel.  It fills the collaterals.  It is a      very regular lesion.  The distal right coronary artery fills the left-      to-right collaterals.  The posterior descending artery is fairly small.  5. The left ventriculogram was performed in the 30-RAO position.  It      somehow did not get recorded on cine.  It reveals akinesis of the      anterior wall.  The ejection  fraction is 25%.  There is inferior      basilar akinesis.  There is no significant mitral regurgitation.   COMPLICATIONS:  None.   CONCLUSIONS:  1. Severe 3-vessel coronary artery disease.  2. Severe left ventricular dysfunction.  We will refer her for coronary      artery bypass grafting.  We will continue her current medications.           ______________________________  Vesta Mixer, M.D.     PJN/MEDQ  D:  08/13/2006  T:  08/14/2006  Job:  [id number]   cc:   Salvatore Decent. Cornelius Moras, M.D.

## 2012-01-18 ENCOUNTER — Encounter: Payer: Self-pay | Admitting: *Deleted

## 2012-01-21 ENCOUNTER — Other Ambulatory Visit: Payer: Self-pay | Admitting: Orthopedic Surgery

## 2012-01-22 ENCOUNTER — Encounter (HOSPITAL_BASED_OUTPATIENT_CLINIC_OR_DEPARTMENT_OTHER): Payer: Self-pay | Admitting: *Deleted

## 2012-01-22 NOTE — Progress Notes (Signed)
To come in for bmet-ekg Sees dr Johney Maine cardiac problems since cabge 2008-no heart meds

## 2012-01-23 ENCOUNTER — Encounter (HOSPITAL_BASED_OUTPATIENT_CLINIC_OR_DEPARTMENT_OTHER)
Admission: RE | Admit: 2012-01-23 | Discharge: 2012-01-23 | Disposition: A | Discharge status: Home / Self Care | Payer: Managed Care, Other (non HMO) | Source: Ambulatory Visit | Attending: Orthopedic Surgery | Admitting: Orthopedic Surgery

## 2012-01-23 LAB — BASIC METABOLIC PANEL
Calcium: 9.4 mg/dL (ref 8.4–10.5)
GFR calc Af Amer: >90 mL/min (ref >90)
GFR calc non Af Amer: >90 mL/min (ref >90)
Potassium: 5 mEq/L (ref 3.5–5.1)
Sodium: 136 mEq/L (ref 135–145)

## 2012-01-23 NOTE — Progress Notes (Signed)
EKG done here shown to Dr. Sampson Goon and would like to have EKG from Dr. Sallee Provencal office to compare. Requested EKG, any cardiac testing and last office notes from Dr. Sallee Provencal office. Explained above to Lorriane to follow - up.

## 2012-01-24 NOTE — Progress Notes (Signed)
Dr fitzgerald reviewed ekgs-ok for surg

## 2012-01-25 ENCOUNTER — Encounter (HOSPITAL_BASED_OUTPATIENT_CLINIC_OR_DEPARTMENT_OTHER): Payer: Self-pay | Admitting: Orthopedic Surgery

## 2012-01-25 ENCOUNTER — Encounter (HOSPITAL_BASED_OUTPATIENT_CLINIC_OR_DEPARTMENT_OTHER)
Admission: RE | Disposition: A | Discharge status: Home / Self Care | Payer: Self-pay | Source: Ambulatory Visit | Attending: Orthopedic Surgery

## 2012-01-25 ENCOUNTER — Encounter (HOSPITAL_BASED_OUTPATIENT_CLINIC_OR_DEPARTMENT_OTHER): Payer: Self-pay | Admitting: Anesthesiology

## 2012-01-25 ENCOUNTER — Encounter (HOSPITAL_BASED_OUTPATIENT_CLINIC_OR_DEPARTMENT_OTHER): Payer: Self-pay | Admitting: *Deleted

## 2012-01-25 ENCOUNTER — Ambulatory Visit (HOSPITAL_BASED_OUTPATIENT_CLINIC_OR_DEPARTMENT_OTHER): Payer: Managed Care, Other (non HMO) | Admitting: Anesthesiology

## 2012-01-25 ENCOUNTER — Ambulatory Visit (HOSPITAL_BASED_OUTPATIENT_CLINIC_OR_DEPARTMENT_OTHER)
Admission: RE | Admit: 2012-01-25 | Discharge: 2012-01-25 | Disposition: A | Discharge status: Home / Self Care | Payer: Managed Care, Other (non HMO) | Source: Ambulatory Visit | Attending: Orthopedic Surgery | Admitting: Orthopedic Surgery

## 2012-01-25 DIAGNOSIS — E785 Hyperlipidemia, unspecified: Secondary | ICD-10-CM | POA: Insufficient documentation

## 2012-01-25 DIAGNOSIS — Z794 Long term (current) use of insulin: Secondary | ICD-10-CM | POA: Insufficient documentation

## 2012-01-25 DIAGNOSIS — I2510 Atherosclerotic heart disease of native coronary artery without angina pectoris: Secondary | ICD-10-CM | POA: Insufficient documentation

## 2012-01-25 DIAGNOSIS — E119 Type 2 diabetes mellitus without complications: Secondary | ICD-10-CM | POA: Insufficient documentation

## 2012-01-25 DIAGNOSIS — M65839 Other synovitis and tenosynovitis, unspecified forearm: Secondary | ICD-10-CM | POA: Insufficient documentation

## 2012-01-25 DIAGNOSIS — Z01810 Encounter for preprocedural cardiovascular examination: Secondary | ICD-10-CM | POA: Insufficient documentation

## 2012-01-25 DIAGNOSIS — M6530 Trigger finger, unspecified finger: Secondary | ICD-10-CM | POA: Insufficient documentation

## 2012-01-25 DIAGNOSIS — I252 Old myocardial infarction: Secondary | ICD-10-CM | POA: Insufficient documentation

## 2012-01-25 DIAGNOSIS — Z01812 Encounter for preprocedural laboratory examination: Secondary | ICD-10-CM | POA: Insufficient documentation

## 2012-01-25 HISTORY — PX: TRIGGER FINGER RELEASE: SHX641

## 2012-01-25 LAB — GLUCOSE, CAPILLARY: Glucose-Capillary: 323 mg/dL — ABNORMAL HIGH (ref 70–99)

## 2012-01-25 LAB — POCT HEMOGLOBIN-HEMACUE: Hemoglobin: 15.1 g/dL — ABNORMAL HIGH (ref 12.0–15.0)

## 2012-01-25 SURGERY — RELEASE, A1 PULLEY, FOR TRIGGER FINGER
Anesthesia: Monitor Anesthesia Care | Site: Finger | Laterality: Bilateral | Wound class: Clean

## 2012-01-25 MED ORDER — HYDROCODONE-ACETAMINOPHEN 5-325 MG PO TABS: ORAL_TABLET | ORAL | Status: AC

## 2012-01-25 MED ORDER — PROPOFOL 10 MG/ML IV EMUL
INTRAVENOUS | Status: DC | PRN
  Administered 2012-01-25: 100 ug/kg/min via INTRAVENOUS

## 2012-01-25 MED ORDER — LIDOCAINE HCL (CARDIAC) 20 MG/ML IV SOLN
INTRAVENOUS | Status: DC | PRN
  Administered 2012-01-25: 30 mg via INTRAVENOUS

## 2012-01-25 MED ORDER — MIDAZOLAM HCL 5 MG/5ML IJ SOLN
INTRAMUSCULAR | Status: DC | PRN
  Administered 2012-01-25: 2 mg via INTRAVENOUS

## 2012-01-25 MED ORDER — CHLORHEXIDINE GLUCONATE 4 % EX LIQD: 60.0000 mL | Freq: Once | CUTANEOUS | Status: DC

## 2012-01-25 MED ORDER — FENTANYL CITRATE 0.05 MG/ML IJ SOLN
INTRAMUSCULAR | Status: DC | PRN
Start: 2012-01-25 — End: 2012-01-25
  Administered 2012-01-25: 50 ug via INTRAVENOUS

## 2012-01-25 MED ORDER — LACTATED RINGERS IV SOLN
INTRAVENOUS | Status: DC
  Administered 2012-01-25: 08:00:00 via INTRAVENOUS

## 2012-01-25 MED ORDER — LIDOCAINE HCL (PF) 2 % IJ SOLN
INTRAMUSCULAR | Status: DC | PRN
  Administered 2012-01-25: 4 mL

## 2012-01-25 SURGICAL SUPPLY — 31 items
BLADE SURG 15 STRL LF DISP TIS (BLADE) ×1 IMPLANT
BLADE SURG 15 STRL SS (BLADE) ×2
BNDG CMPR 9X4 STRL LF SNTH (GAUZE/BANDAGES/DRESSINGS)
BNDG CMPR MD 5X2 ELC HKLP STRL (GAUZE/BANDAGES/DRESSINGS) ×1
BNDG ELASTIC 2 VLCR STRL LF (GAUZE/BANDAGES/DRESSINGS) ×2 IMPLANT
BNDG ESMARK 4X9 LF (GAUZE/BANDAGES/DRESSINGS) IMPLANT
BRUSH SCRUB EZ PLAIN DRY (MISCELLANEOUS) ×2 IMPLANT
CLOTH BEACON ORANGE TIMEOUT ST (SAFETY) ×2 IMPLANT
CORDS BIPOLAR (ELECTRODE) ×2 IMPLANT
COVER MAYO STAND STRL (DRAPES) ×2 IMPLANT
COVER TABLE BACK 60X90 (DRAPES) ×2 IMPLANT
CUFF TOURNIQUET SINGLE 18IN (TOURNIQUET CUFF) ×2 IMPLANT
DECANTER SPIKE VIAL GLASS SM (MISCELLANEOUS) IMPLANT
DRAPE EXTREMITY T 121X128X90 (DRAPE) ×2 IMPLANT
DRAPE SURG 17X23 STRL (DRAPES) ×2 IMPLANT
GAUZE SPONGE 4X4 12PLY STRL LF (GAUZE/BANDAGES/DRESSINGS) ×4 IMPLANT
GAUZE XEROFORM 1X8 LF (GAUZE/BANDAGES/DRESSINGS) ×2 IMPLANT
GLOVE BIOGEL M STRL SZ7.5 (GLOVE) ×2 IMPLANT
GLOVE ORTHO TXT STRL SZ7.5 (GLOVE) ×2 IMPLANT
GOWN PREVENTION PLUS XLARGE (GOWN DISPOSABLE) ×2 IMPLANT
GOWN STRL REIN XL XLG (GOWN DISPOSABLE) ×4 IMPLANT
NEEDLE 27GAX1X1/2 (NEEDLE) IMPLANT
PACK BASIN DAY SURGERY FS (CUSTOM PROCEDURE TRAY) ×2 IMPLANT
PAD CAST 4YDX4 CTTN HI CHSV (CAST SUPPLIES) ×1 IMPLANT
PADDING CAST COTTON 4X4 STRL (CAST SUPPLIES) ×2
SPONGE GAUZE 4X4 12PLY (GAUZE/BANDAGES/DRESSINGS) ×2 IMPLANT
STOCKINETTE 4X48 STRL (DRAPES) ×2 IMPLANT
SYR CONTROL 10ML LL (SYRINGE) IMPLANT
TOWEL OR 17X24 6PK STRL BLUE (TOWEL DISPOSABLE) ×2 IMPLANT
UNDERPAD 30X30 INCONTINENT (UNDERPADS AND DIAPERS) ×2 IMPLANT
WATER STERILE IRR 1000ML POUR (IV SOLUTION) IMPLANT

## 2012-01-25 NOTE — Anesthesia Preprocedure Evaluation (Signed)
Anesthesia Evaluation  Patient identified by MRN, date of birth, ID band Patient awake    Reviewed: Allergy & Precautions, H&P , NPO status , Patient's Chart, lab work & pertinent test results, reviewed documented beta blocker date and time   Airway Mallampati: II TM Distance: >3 FB Neck ROM: full    Dental   Pulmonary neg pulmonary ROS,          Cardiovascular + CAD and + Past MI     Neuro/Psych negative neurological ROS  negative psych ROS   GI/Hepatic negative GI ROS, Neg liver ROS,   Endo/Other  Diabetes mellitus-, Insulin Dependent and Oral Hypoglycemic Agents  Renal/GU negative Renal ROS  negative genitourinary   Musculoskeletal   Abdominal   Peds  Hematology negative hematology ROS (+)   Anesthesia Other Findings See surgeon's H&P   Reproductive/Obstetrics negative OB ROS                           Anesthesia Physical Anesthesia Plan  ASA: III  Anesthesia Plan: MAC   Post-op Pain Management:    Induction: Intravenous  Airway Management Planned: Simple Face Mask  Additional Equipment:   Intra-op Plan:   Post-operative Plan:   Informed Consent: I have reviewed the patients History and Physical, chart, labs and discussed the procedure including the risks, benefits and alternatives for the proposed anesthesia with the patient or authorized representative who has indicated his/her understanding and acceptance.     Plan Discussed with: CRNA and Surgeon  Anesthesia Plan Comments:         Anesthesia Quick Evaluation

## 2012-01-25 NOTE — Discharge Instructions (Signed)

## 2012-01-25 NOTE — Anesthesia Postprocedure Evaluation (Signed)
Anesthesia Post Note  Patient: [name]  Procedure(s) Performed: Procedure(s) (LRB): RELEASE TRIGGER FINGER/A-1 PULLEY (Bilateral)  Anesthesia type: MAC  Patient location: PACU  Post pain: Pain level controlled  Post assessment: Patient's Cardiovascular Status Stable  Last Vitals:  Filed Vitals:   01/25/12 0922  BP: 149/76  Pulse: 69  Temp: 36.5 C  Resp: 18    Post vital signs: Reviewed and stable  Level of consciousness: alert  Complications: No apparent anesthesia complications

## 2012-01-25 NOTE — Brief Op Note (Signed)
01/25/2012  8:33 AM  PATIENT:  [name]  51 y.o. female  PRE-OPERATIVE DIAGNOSIS: right trigger thumb, small finger trigger right and trigger small finger left  POST-OPERATIVE DIAGNOSIS:  same  PROCEDURE:   RELEASE TRIGGER FINGER/A-1 PULLEY  Right thumb, small finger and left small finger  SURGEON:Racine Erby V Naaman Plummer., MD   PHYSICIAN ASSISTANT:   ASSISTANTS: Mallory Shirk.A-C   ANESTHESIA:   IV sedation  EBL:  Total I/O In: 500 [I.V.:500] Out: -   BLOOD ADMINISTERED:none  DRAINS: none   LOCAL MEDICATIONS USED:  XYLOCAINE   SPECIMEN:  No Specimen  DISPOSITION OF SPECIMEN:  N/A  COUNTS:  YES  TOURNIQUET:   Total Tourniquet Time Documented: Upper Arm (Left) - 8 minutes  DICTATION: .Other Dictation: Dictation Number [id number]  PLAN OF CARE: Discharge to home after PACU  PATIENT DISPOSITION:  PACU - hemodynamically stable.

## 2012-01-25 NOTE — Transfer of Care (Signed)
Immediate Anesthesia Transfer of Care Note  Patient: [name]  Procedure(s) Performed: Procedure(s) (LRB): RELEASE TRIGGER FINGER/A-1 PULLEY (Bilateral)  Patient Location: PACU  Anesthesia Type: MAC  Level of Consciousness: sedated  Airway & Oxygen Therapy: Patient Spontanous Breathing and Patient connected to face mask oxygen  Post-op Assessment: Report given to PACU RN and Post -op Vital signs reviewed and stable  Post vital signs: Reviewed and stable  Complications: No apparent anesthesia complications

## 2012-01-25 NOTE — Op Note (Signed)
NAMETRIVA, HUEBER NO.:  [id number]  MEDICAL RECORD NO.:  [id number]  LOCATION:                               FACILITY:  [facility]  PHYSICIAN:  Katy Fitch. Olivya Sobol, M.D. DATE OF BIRTH:  10-13-1960  DATE OF PROCEDURE:  01/25/2012 DATE OF DISCHARGE:                              OPERATIVE REPORT   PREOPERATIVE DIAGNOSIS:  Locking stenosing tenosynovitis, right thumb, right small finger, and left small finger associated with poorly controlled diabetes.  POSTOPERATIVE DIAGNOSIS:  Locking stenosing tenosynovitis, right thumb, right small finger, and left small finger associated with poorly controlled diabetes.  OPERATION: 1. Release of right thumb A1 pulley. 2. Release of right ring finger A1 pulley. 3. Release of left small finger A1 pulley.  OPERATING SURGEON:  Katy Fitch. Sakina Briones, MD.  ASSISTANT:  Marveen Reeks. Dasnoit, PA-C.  ANESTHESIA:  IV sedation/local monitored anesthesia care.  SUPERVISING ANESTHESIOLOGIST:  Janetta Hora. Gelene Mink, MD.  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 Andi Devon for evaluation and management of bilateral trigger fingers.  She has diabetes controlled by metformin and insulin.  Her blood glucose this morning was 340.  She reports that she had overeaten in the past 24 hours.  She was advised to proceed with release of her A1 pulleys.  She has had prior successful long finger release surgery.  She presents for release of the aforementioned right thumb, right small finger, and left small finger at this time.  PROCEDURE:  Johari Bennetts was brought to room 1 of the [facility] and placed in supine position on the operating table.  Following anesthesia and informed consent, IV sedation was provided. The right and left upper extremities were prepped with Betadine soap solution, sterilely draped with impervious arthroscopy drapes and sterile stockinette.  As the IV was  in the proximal forearm on the right, the right hand and arm were exsanguinated with an Esmarch bandage, which remained on the proximal forearm as an arterial tourniquet.  Lidocaine 2% had been infiltrated into the path of the intended incisions in the flexor sheath of the right thumb and right small finger.  Transverse incisions were fashioned directly over the A1 pulley of the thumb.  Subcutaneous tissues were carefully divided taking care to retract the radial proper digital nerve.  The A1 pulley was isolated, split with scalpel and scissors.  Thereafter, free range of motion of the IP joint was recovered.  The wound was then repaired with intradermal 3-0 Prolene and Steri- Strips.  Attention was then directed to the small finger.  An oblique incision was fashioned directly over the palpably thickened A1 pulley of the small finger.  Blunt Ragnell retractors were placed, protecting the neurovascular structures.  The A1 pulley was isolated, split with scalpel and scissors.  This wound was then repaired with intradermal 3-0 Prolene.  The Esmarch bandage was released on the right arm with immediate cap refill to the fingers and thumb.  A compressive dressing was applied with Steri-Strips, sterile gauze, and Ace wrap.  On the left, the left arm was exsanguinated with Esmarch bandage and an arterial tourniquet on proximal brachium inflated to 220 mmHg. Lidocaine 2%  was infiltrated in the path of the intended incision in the flexor sheath of the small finger.  When anesthesia was satisfactory, an oblique incision was fashioned directly over the A1 pulley.  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  This was removed with a rongeur.  Pulley was then split with scalpel and scissors. Thereafter, free range of motion of the small finger was recovered.  This wound was likewise repaired with intradermal 3-0 Prolene and a Steri-Strip.  The left hand was then dressed with  sterile gauze and Ace wrap.  For aftercare, Ms. Novella was advised to remove her large dressings in 3 days and provide Band-Aid dressings to the fingers.  She will return to our office for followup in approximately 7 days for suture removal. Questions were invited and answered in detail.     Katy Fitch Francessca Friis, M.D.     RVS/MEDQ  D:  01/25/2012  T:  01/25/2012  Job:  [id number]  cc:   Merlene Laughter. Renae Gloss, M.D.

## 2012-01-25 NOTE — H&P (Signed)
[name] is an 51 y.o. female.   Chief Complaint: Complaining of triggering of multiple fingers HPI: Patient is a 51 year old right-hand-dominant female with a long history of insulin-dependent diabetes. She's been a long-standing patient of our practice and we have treated her in the past for trigger fingers. She presented to our office approximately one week ago complaining of triggering of both small fingers and her right thumb. Due to her brittle insulin-dependent diabetes. It was felt that she should not undergo steroid injections, and we, therefore, felt that she would be best served by undergoing surgical intervention.  Past Medical History  Diagnosis Date  . Coronary artery disease     s/p cabg 2011  . Hyperlipidemia   . Additional heart attack (anterior wall)     also inferior wall MI  . Diabetes mellitus     Past Surgical History  Procedure Date  . Coronary artery bypass graft 2008  . Cholecystectomy   . Abdominal hysterectomy   . Trigger finger release     rt middle  . Tubal ligation     Family History  Problem Relation Age of Onset  . Heart disease Mother   . Heart disease Father    Social History:  reports that she has been smoking.  She does not have any smokeless tobacco history on file. She reports that she drinks alcohol. She reports that she does not use illicit drugs.  Allergies: No Known Allergies  Medications Prior to Admission  Medication Dose Route Frequency Provider Last Rate Last Dose  . chlorhexidine (HIBICLENS) 4 % liquid 4 application  60 mL Topical Once Wyn Forster., MD      . lactated ringers infusion   Intravenous Continuous Bedelia Person, MD       Medications Prior to Admission  Medication Sig Dispense Refill  . aspirin 81 MG tablet Take 81 mg by mouth daily.      Marland Kitchen atorvastatin (LIPITOR) 40 MG tablet Take 40 mg by mouth daily.      . cholecalciferol (VITAMIN D) 1000 UNITS tablet Take 1,000 Units by mouth daily. Takes 2      .  fish oil-omega-3 fatty acids 1000 MG capsule Take 1,200 mg by mouth daily.      Marland Kitchen gabapentin (NEURONTIN) 300 MG capsule Take 600 mg by mouth 2 (two) times daily.      . insulin aspart (NOVOLOG) 100 UNIT/ML injection Inject into the skin 3 (three) times daily before meals.      . insulin glargine (LANTUS) 100 UNIT/ML injection Inject 54 Units into the skin daily.       . metFORMIN (GLUCOPHAGE) 500 MG tablet Take 500 mg by mouth daily with breakfast.        Results for orders placed during the hospital encounter of 01/25/12 (from the past 48 hour(s))  BASIC METABOLIC PANEL     Status: Abnormal   Collection Time   01/23/12 12:15 PM      Component Value Range Comment   Sodium 136  135 - 145 (mEq/L)    Potassium 5.0  3.5 - 5.1 (mEq/L)    Chloride 101  96 - 112 (mEq/L)    CO2 25  19 - 32 (mEq/L)    Glucose, Bld 183 (*) 70 - 99 (mg/dL)    BUN 18  6 - 23 (mg/dL)    Creatinine, Ser 1.61  0.50 - 1.10 (mg/dL)    Calcium 9.4  8.4 - 10.5 (mg/dL)    GFR calc  non Af Amer >90  >90 (mL/min)    GFR calc Af Amer >90  >90 (mL/min)   GLUCOSE, CAPILLARY     Status: Abnormal   Collection Time   01/25/12  7:04 AM      Component Value Range Comment   Glucose-Capillary 340 (*) 70 - 99 (mg/dL)   POCT HEMOGLOBIN-HEMACUE     Status: Abnormal   Collection Time   01/25/12  7:06 AM      Component Value Range Comment   Hemoglobin 15.1 (*) 12.0 - 15.0 (g/dL)     No results found.   Pertinent items are noted in HPI.  Blood pressure 132/75, pulse 70, temperature 97.6 F (36.4 C), temperature source Oral, resp. rate 20, height 5\' 6"  (1.676 m), weight 77.111 kg (170 lb), SpO2 96.00%.  General appearance: alert Head: Normocephalic, without obvious abnormality Neck: supple, symmetrical, trachea midline Resp: clear to auscultation bilaterally Cardio: regular rate and rhythm, S1, S2 normal, no murmur, click, rub or gallop GI: normal findings: bowel sounds normal Extremities: Examination of her hands reveals  active triggering of both small fingers and of her right thumb. She has excellent motion of her other digits. Neurovascular she is intact. Pulses: 2+ and symmetric Skin: normal Neurologic: Grossly normal    Assessment/Plan Impression: Stenosing tenosynovitis, bilateral small fingers, and right thumb  Plan: Patient to be taken to the operating room to undergo release of the A1 pulley of bilateral small fingers and right thumb. The procedure risks, benefits, and postoperative course were discussed with the patient at length and she was in agreement with this plan.  AnnaNeftaly Inzunza Armstrong 01/25/2012, 7:25 AM     H&P documentation: 01/25/2012  -History and Physical Reviewed  -Patient has been re-examined  -No change in the plan of care  Wyn Forster, MD

## 2012-01-25 NOTE — Op Note (Signed)
OP NOTE DICTATED [id number]

## 2012-01-28 ENCOUNTER — Encounter (HOSPITAL_BASED_OUTPATIENT_CLINIC_OR_DEPARTMENT_OTHER): Payer: Self-pay | Admitting: Orthopedic Surgery

## 2012-01-28 NOTE — Addendum Note (Signed)
Addendum  created 01/28/12 1015 by Lance Coon, CRNA   Modules edited:Anesthesia Responsible Staff

## 2012-01-28 NOTE — Addendum Note (Signed)
Addendum  created 01/28/12 1015 by Brumley Corianne Buccellato, CRNA   Modules edited:Anesthesia Responsible Staff    

## 2012-07-09 ENCOUNTER — Encounter: Payer: Self-pay | Admitting: Cardiovascular Disease

## 2016-02-15 ENCOUNTER — Emergency Department (HOSPITAL_COMMUNITY): Payer: BLUE CROSS/BLUE SHIELD

## 2016-02-15 ENCOUNTER — Encounter (HOSPITAL_COMMUNITY): Payer: Self-pay | Admitting: Emergency Medicine

## 2016-02-15 ENCOUNTER — Inpatient Hospital Stay (HOSPITAL_COMMUNITY)
Admission: EM | Admit: 2016-02-15 | Discharge: 2016-02-19 | DRG: 638 | Disposition: A | Discharge status: Home / Self Care | Payer: BLUE CROSS/BLUE SHIELD | Attending: Internal Medicine | Admitting: Internal Medicine

## 2016-02-15 ENCOUNTER — Inpatient Hospital Stay (HOSPITAL_COMMUNITY): Payer: BLUE CROSS/BLUE SHIELD

## 2016-02-15 DIAGNOSIS — I272 Other secondary pulmonary hypertension: Secondary | ICD-10-CM | POA: Diagnosis present

## 2016-02-15 DIAGNOSIS — D72829 Elevated white blood cell count, unspecified: Secondary | ICD-10-CM | POA: Diagnosis present

## 2016-02-15 DIAGNOSIS — F329 Major depressive disorder, single episode, unspecified: Secondary | ICD-10-CM | POA: Diagnosis present

## 2016-02-15 DIAGNOSIS — E1165 Type 2 diabetes mellitus with hyperglycemia: Secondary | ICD-10-CM | POA: Diagnosis present

## 2016-02-15 DIAGNOSIS — I252 Old myocardial infarction: Secondary | ICD-10-CM

## 2016-02-15 DIAGNOSIS — Z90710 Acquired absence of both cervix and uterus: Secondary | ICD-10-CM | POA: Diagnosis not present

## 2016-02-15 DIAGNOSIS — Z9049 Acquired absence of other specified parts of digestive tract: Secondary | ICD-10-CM | POA: Diagnosis not present

## 2016-02-15 DIAGNOSIS — Z79899 Other long term (current) drug therapy: Secondary | ICD-10-CM

## 2016-02-15 DIAGNOSIS — I5022 Chronic systolic (congestive) heart failure: Secondary | ICD-10-CM | POA: Diagnosis present

## 2016-02-15 DIAGNOSIS — I739 Peripheral vascular disease, unspecified: Secondary | ICD-10-CM | POA: Diagnosis present

## 2016-02-15 DIAGNOSIS — Z888 Allergy status to other drugs, medicaments and biological substances status: Secondary | ICD-10-CM | POA: Diagnosis not present

## 2016-02-15 DIAGNOSIS — E1143 Type 2 diabetes mellitus with diabetic autonomic (poly)neuropathy: Secondary | ICD-10-CM | POA: Diagnosis present

## 2016-02-15 DIAGNOSIS — Z720 Tobacco use: Secondary | ICD-10-CM

## 2016-02-15 DIAGNOSIS — E1310 Other specified diabetes mellitus with ketoacidosis without coma: Secondary | ICD-10-CM | POA: Diagnosis present

## 2016-02-15 DIAGNOSIS — I2510 Atherosclerotic heart disease of native coronary artery without angina pectoris: Secondary | ICD-10-CM | POA: Diagnosis present

## 2016-02-15 DIAGNOSIS — K3184 Gastroparesis: Secondary | ICD-10-CM | POA: Diagnosis present

## 2016-02-15 DIAGNOSIS — Z9851 Tubal ligation status: Secondary | ICD-10-CM | POA: Diagnosis not present

## 2016-02-15 DIAGNOSIS — Z7982 Long term (current) use of aspirin: Secondary | ICD-10-CM

## 2016-02-15 DIAGNOSIS — E1010 Type 1 diabetes mellitus with ketoacidosis without coma: Secondary | ICD-10-CM | POA: Diagnosis not present

## 2016-02-15 DIAGNOSIS — E876 Hypokalemia: Secondary | ICD-10-CM | POA: Diagnosis present

## 2016-02-15 DIAGNOSIS — E1142 Type 2 diabetes mellitus with diabetic polyneuropathy: Secondary | ICD-10-CM | POA: Diagnosis present

## 2016-02-15 DIAGNOSIS — Z7984 Long term (current) use of oral hypoglycemic drugs: Secondary | ICD-10-CM

## 2016-02-15 DIAGNOSIS — Z8249 Family history of ischemic heart disease and other diseases of the circulatory system: Secondary | ICD-10-CM

## 2016-02-15 DIAGNOSIS — F1290 Cannabis use, unspecified, uncomplicated: Secondary | ICD-10-CM | POA: Diagnosis present

## 2016-02-15 DIAGNOSIS — Z9889 Other specified postprocedural states: Secondary | ICD-10-CM

## 2016-02-15 DIAGNOSIS — G629 Polyneuropathy, unspecified: Secondary | ICD-10-CM

## 2016-02-15 DIAGNOSIS — I5040 Unspecified combined systolic (congestive) and diastolic (congestive) heart failure: Secondary | ICD-10-CM | POA: Diagnosis present

## 2016-02-15 DIAGNOSIS — F419 Anxiety disorder, unspecified: Secondary | ICD-10-CM | POA: Diagnosis present

## 2016-02-15 DIAGNOSIS — E1110 Type 2 diabetes mellitus with ketoacidosis without coma: Secondary | ICD-10-CM | POA: Diagnosis present

## 2016-02-15 DIAGNOSIS — Z794 Long term (current) use of insulin: Secondary | ICD-10-CM

## 2016-02-15 DIAGNOSIS — F32A Depression, unspecified: Secondary | ICD-10-CM | POA: Diagnosis present

## 2016-02-15 DIAGNOSIS — Z951 Presence of aortocoronary bypass graft: Secondary | ICD-10-CM | POA: Diagnosis not present

## 2016-02-15 DIAGNOSIS — E785 Hyperlipidemia, unspecified: Secondary | ICD-10-CM | POA: Diagnosis present

## 2016-02-15 LAB — CBG MONITORING, ED
GLUCOSE-CAPILLARY: 347 mg/dL — AB (ref 65–99)
GLUCOSE-CAPILLARY: 254 mg/dL — AB (ref 65–99)
GLUCOSE-CAPILLARY: 194 mg/dL — AB (ref 65–99)
GLUCOSE-CAPILLARY: 181 mg/dL — AB (ref 65–99)
GLUCOSE-CAPILLARY: 154 mg/dL — AB (ref 65–99)
Glucose-Capillary: >600 mg/dL (ref 65–99)
Glucose-Capillary: 588 mg/dL (ref 65–99)
Glucose-Capillary: 454 mg/dL — ABNORMAL HIGH (ref 65–99)
Glucose-Capillary: 285 mg/dL — ABNORMAL HIGH (ref 65–99)
Glucose-Capillary: 138 mg/dL — ABNORMAL HIGH (ref 65–99)

## 2016-02-15 LAB — COMPREHENSIVE METABOLIC PANEL
ALBUMIN: 3.9 g/dL (ref 3.5–5.0)
ALK PHOS: 101 U/L (ref 38–126)
ALT: 26 U/L (ref 14–54)
ANION GAP: 29 — AB (ref 5–15)
AST: 23 U/L (ref 15–41)
BUN: 32 mg/dL — ABNORMAL HIGH (ref 6–20)
CALCIUM: 9.4 mg/dL (ref 8.9–10.3)
CHLORIDE: 96 mmol/L — AB (ref 101–111)
CO2: 9 mmol/L — AB (ref 22–32)
CREATININE: 1.71 mg/dL — AB (ref 0.44–1.00)
GFR calc Af Amer: 38 mL/min — ABNORMAL LOW (ref >60)
GFR calc non Af Amer: 33 mL/min — ABNORMAL LOW (ref >60)
GLUCOSE: 831 mg/dL — AB (ref 65–99)
Potassium: 4.7 mmol/L (ref 3.5–5.1)
SODIUM: 134 mmol/L — AB (ref 135–145)
Total Bilirubin: 1.8 mg/dL — ABNORMAL HIGH (ref 0.3–1.2)
Total Protein: 6.6 g/dL (ref 6.5–8.1)

## 2016-02-15 LAB — I-STAT CHEM 8, ED
BUN: 33 mg/dL — ABNORMAL HIGH (ref 6–20)
CHLORIDE: 99 mmol/L — AB (ref 101–111)
Calcium, Ion: 1.1 mmol/L — ABNORMAL LOW (ref 1.12–1.23)
Creatinine, Ser: 0.9 mg/dL (ref 0.44–1.00)
HCT: 47 % — ABNORMAL HIGH (ref 36.0–46.0)
Hemoglobin: 16 g/dL — ABNORMAL HIGH (ref 12.0–15.0)
POTASSIUM: 4.6 mmol/L (ref 3.5–5.1)
SODIUM: 133 mmol/L — AB (ref 135–145)
TCO2: 12 mmol/L (ref 0–100)

## 2016-02-15 LAB — BASIC METABOLIC PANEL
Anion gap: 24 — ABNORMAL HIGH (ref 5–15)
Anion gap: 17 — ABNORMAL HIGH (ref 5–15)
Anion gap: 14 (ref 5–15)
BUN: 35 mg/dL — AB (ref 6–20)
BUN: 31 mg/dL — AB (ref 6–20)
BUN: 27 mg/dL — AB (ref 6–20)
CALCIUM: 8.5 mg/dL — AB (ref 8.9–10.3)
CALCIUM: 8.8 mg/dL — AB (ref 8.9–10.3)
CO2: 8 mmol/L — ABNORMAL LOW (ref 22–32)
CO2: 15 mmol/L — ABNORMAL LOW (ref 22–32)
CO2: 18 mmol/L — ABNORMAL LOW (ref 22–32)
CREATININE: 1.68 mg/dL — AB (ref 0.44–1.00)
CREATININE: 1.48 mg/dL — AB (ref 0.44–1.00)
CREATININE: 1.16 mg/dL — AB (ref 0.44–1.00)
Calcium: 8.6 mg/dL — ABNORMAL LOW (ref 8.9–10.3)
Chloride: 105 mmol/L (ref 101–111)
Chloride: 112 mmol/L — ABNORMAL HIGH (ref 101–111)
Chloride: 111 mmol/L (ref 101–111)
GFR calc Af Amer: 39 mL/min — ABNORMAL LOW (ref >60)
GFR calc Af Amer: 45 mL/min — ABNORMAL LOW (ref >60)
GFR calc Af Amer: >60 mL/min (ref >60)
GFR, EST NON AFRICAN AMERICAN: 33 mL/min — AB (ref >60)
GFR, EST NON AFRICAN AMERICAN: 39 mL/min — AB (ref >60)
GFR, EST NON AFRICAN AMERICAN: 52 mL/min — AB (ref >60)
GLUCOSE: 774 mg/dL — AB (ref 65–99)
GLUCOSE: 405 mg/dL — AB (ref 65–99)
GLUCOSE: 190 mg/dL — AB (ref 65–99)
POTASSIUM: 4.4 mmol/L (ref 3.5–5.1)
Potassium: 3.6 mmol/L (ref 3.5–5.1)
Potassium: 4 mmol/L (ref 3.5–5.1)
SODIUM: 137 mmol/L (ref 135–145)
SODIUM: 144 mmol/L (ref 135–145)
SODIUM: 143 mmol/L (ref 135–145)

## 2016-02-15 LAB — LIPASE, BLOOD: Lipase: 21 U/L (ref 11–51)

## 2016-02-15 LAB — ECHOCARDIOGRAM COMPLETE
Height: 66 in
Weight: 2512 oz

## 2016-02-15 LAB — URINALYSIS, ROUTINE W REFLEX MICROSCOPIC
Bilirubin Urine: NEGATIVE
Glucose, UA: >1000 mg/dL — AB
Hgb urine dipstick: NEGATIVE
Ketones, ur: >80 mg/dL — AB
LEUKOCYTES UA: NEGATIVE
NITRITE: NEGATIVE
PH: 5 (ref 5.0–8.0)
Protein, ur: NEGATIVE mg/dL
SPECIFIC GRAVITY, URINE: 1.029 (ref 1.005–1.030)

## 2016-02-15 LAB — I-STAT VENOUS BLOOD GAS, ED
ACID-BASE DEFICIT: 17 mmol/L — AB (ref 0.0–2.0)
BICARBONATE: 10.8 meq/L — AB (ref 20.0–24.0)
O2 Saturation: 75 %
PCO2 VEN: 32.1 mmHg — AB (ref 45.0–50.0)
PH VEN: 7.133 — AB (ref 7.250–7.300)
PO2 VEN: 52 mmHg — AB (ref 31.0–45.0)
TCO2: 12 mmol/L (ref 0–100)

## 2016-02-15 LAB — TROPONIN I: Troponin I: <0.03 ng/mL (ref <0.031)

## 2016-02-15 LAB — CBC
HEMATOCRIT: 42.6 % (ref 36.0–46.0)
Hemoglobin: 13.6 g/dL (ref 12.0–15.0)
MCH: 29.1 pg (ref 26.0–34.0)
MCHC: 31.9 g/dL (ref 30.0–36.0)
MCV: 91 fL (ref 78.0–100.0)
Platelets: 313 10*3/uL (ref 150–400)
RBC: 4.68 MIL/uL (ref 3.87–5.11)
RDW: 13.4 % (ref 11.5–15.5)
WBC: 20.8 10*3/uL — ABNORMAL HIGH (ref 4.0–10.5)

## 2016-02-15 LAB — URINE MICROSCOPIC-ADD ON
BACTERIA UA: NONE SEEN
WBC UA: NONE SEEN WBC/hpf (ref 0–5)

## 2016-02-15 LAB — MAGNESIUM: MAGNESIUM: 2 mg/dL (ref 1.7–2.4)

## 2016-02-15 LAB — PHOSPHORUS: Phosphorus: 6.8 mg/dL — ABNORMAL HIGH (ref 2.5–4.6)

## 2016-02-15 MED ORDER — DEXTROSE-NACL 5-0.45 % IV SOLN: INTRAVENOUS | Status: DC

## 2016-02-15 MED ORDER — FENTANYL CITRATE (PF) 100 MCG/2ML IJ SOLN
50.0000 ug | Freq: Once | INTRAMUSCULAR | Status: AC
  Administered 2016-02-15: 50 ug via INTRAVENOUS
  Filled 2016-02-15: qty 2

## 2016-02-15 MED ORDER — ONDANSETRON HCL 4 MG/2ML IJ SOLN
4.0000 mg | Freq: Four times a day (QID) | INTRAMUSCULAR | Status: DC | PRN
  Administered 2016-02-15 – 2016-02-17 (×4): 4 mg via INTRAVENOUS
  Filled 2016-02-15 (×4): qty 2

## 2016-02-15 MED ORDER — DEXTROSE-NACL 5-0.45 % IV SOLN
INTRAVENOUS | Status: DC
  Administered 2016-02-15: 20:00:00 via INTRAVENOUS

## 2016-02-15 MED ORDER — ENOXAPARIN SODIUM 40 MG/0.4ML ~~LOC~~ SOLN
40.0000 mg | SUBCUTANEOUS | Status: DC
  Administered 2016-02-16 – 2016-02-19 (×4): 40 mg via SUBCUTANEOUS
  Filled 2016-02-15 (×5): qty 0.4

## 2016-02-15 MED ORDER — SODIUM CHLORIDE 0.9 % IV SOLN
INTRAVENOUS | Status: DC
  Administered 2016-02-15: 15:00:00 via INTRAVENOUS

## 2016-02-15 MED ORDER — PROCHLORPERAZINE EDISYLATE 5 MG/ML IJ SOLN
5.0000 mg | Freq: Once | INTRAMUSCULAR | Status: AC
  Administered 2016-02-15: 5 mg via INTRAVENOUS
  Filled 2016-02-15: qty 2

## 2016-02-15 MED ORDER — SODIUM CHLORIDE 0.9 % IV SOLN: INTRAVENOUS | Status: DC

## 2016-02-15 MED ORDER — SODIUM CHLORIDE 0.9 % IV BOLUS (SEPSIS)
1000.0000 mL | Freq: Once | INTRAVENOUS | Status: AC
  Administered 2016-02-15: 1000 mL via INTRAVENOUS

## 2016-02-15 MED ORDER — POTASSIUM CHLORIDE 10 MEQ/100ML IV SOLN
10.0000 meq | INTRAVENOUS | Status: AC
  Administered 2016-02-15 (×2): 10 meq via INTRAVENOUS
  Filled 2016-02-15: qty 100

## 2016-02-15 MED ORDER — CLOPIDOGREL BISULFATE 75 MG PO TABS
75.0000 mg | ORAL_TABLET | Freq: Every day | ORAL | Status: DC
  Administered 2016-02-18 – 2016-02-19 (×2): 75 mg via ORAL
  Filled 2016-02-15 (×2): qty 1

## 2016-02-15 MED ORDER — ASPIRIN 81 MG PO CHEW
81.0000 mg | CHEWABLE_TABLET | Freq: Every day | ORAL | Status: DC
  Administered 2016-02-18 – 2016-02-19 (×2): 81 mg via ORAL
  Filled 2016-02-15 (×2): qty 1

## 2016-02-15 MED ORDER — CILOSTAZOL 100 MG PO TABS
100.0000 mg | ORAL_TABLET | Freq: Two times a day (BID) | ORAL | Status: DC
  Administered 2016-02-16 – 2016-02-18 (×4): 100 mg via ORAL
  Filled 2016-02-15 (×7): qty 1

## 2016-02-15 MED ORDER — INSULIN REGULAR HUMAN 100 UNIT/ML IJ SOLN
INTRAMUSCULAR | Status: DC
  Administered 2016-02-15: 10.8 [IU]/h via INTRAVENOUS

## 2016-02-15 MED ORDER — HYDROMORPHONE HCL 1 MG/ML IJ SOLN
0.5000 mg | INTRAMUSCULAR | Status: DC | PRN
  Administered 2016-02-15 – 2016-02-16 (×3): 0.5 mg via INTRAVENOUS
  Filled 2016-02-15 (×3): qty 1

## 2016-02-15 MED ORDER — POTASSIUM CHLORIDE 10 MEQ/100ML IV SOLN
10.0000 meq | INTRAVENOUS | Status: AC
  Filled 2016-02-15: qty 100

## 2016-02-15 MED ORDER — SODIUM CHLORIDE 0.9 % IV SOLN
INTRAVENOUS | Status: DC
  Administered 2016-02-15: 5.4 [IU]/h via INTRAVENOUS
  Filled 2016-02-15: qty 2.5

## 2016-02-15 NOTE — Progress Notes (Signed)
  Echocardiogram 2D Echocardiogram has been performed.  Arvil ChacoFoster, Jered Heiny 02/15/2016, 2:00 PM

## 2016-02-15 NOTE — ED Notes (Signed)
Echo at bedside

## 2016-02-15 NOTE — ED Notes (Signed)
Dr. Merrell MD at bedside. 

## 2016-02-15 NOTE — ED Notes (Signed)
Pt's daughter states "phenergren only thing that works for nausea"

## 2016-02-15 NOTE — ED Notes (Addendum)
Critical Lab Value:  774 Glucose MD  aware.

## 2016-02-15 NOTE — ED Provider Notes (Signed)
CSN: [id number]     Arrival date & time 02/15/16  0904 History   First MD Initiated Contact with Patient 02/15/16 [phone]     Chief Complaint  Patient presents with  . Hyperglycemia      Patient is a 55 y.o. female presenting with hyperglycemia. The history is provided by the patient.  Hyperglycemia Associated symptoms: nausea and vomiting   Associated symptoms: no shortness of breath   Patient is a diabetic. Presents with nausea vomiting and some diarrhea. States she's had some incontinence. She felt bad for last couple days. Initial sugar found to be greater than 600. Also has nausea and vomiting with epigastric pain. No fevers or chills. She has had urinary frequency.  Past Medical History  Diagnosis Date  . Coronary artery disease     s/p cabg 2011  . Hyperlipidemia   . Additional heart attack (anterior wall)     also inferior wall MI  . Diabetes mellitus    Past Surgical History  Procedure Laterality Date  . Coronary artery bypass graft  2008  . Cholecystectomy    . Abdominal hysterectomy    . Trigger finger release      rt middle  . Tubal ligation    . Trigger finger release  01/25/2012    Procedure: RELEASE TRIGGER FINGER/A-1 PULLEY;  Surgeon: Wyn Forster., MD;  Location: [facility];  Service: Orthopedics;  Laterality: Bilateral;  left small, right small, right thumb   Family History  Problem Relation Age of Onset  . Heart disease Mother   . Heart disease Father    Social History  Substance Use Topics  . Smoking status: Former Smoker -- 0.25 packs/day    Quit date: 02/10/2016  . Smokeless tobacco: None  . Alcohol Use: Yes     Comment: rare   OB History    No data available     Review of Systems  Constitutional: Positive for appetite change.  HENT: Negative for facial swelling.   Respiratory: Negative for shortness of breath.   Gastrointestinal: Positive for nausea, vomiting and diarrhea.  Genitourinary: Negative for flank pain.   Musculoskeletal: Positive for myalgias. Negative for back pain.  Skin: Negative for wound.  Neurological: Negative for numbness.      Allergies  Levaquin  Home Medications   Prior to Admission medications   Medication Sig Start Date End Date Taking? Authorizing Provider  ALPRAZolam Prudy Feeler) 0.5 MG tablet Take 0.5 mg by mouth 2 (two) times daily as needed for anxiety.   Yes Historical Provider, MD  aspirin 81 MG tablet Take 81 mg by mouth daily.   Yes Historical Provider, MD  atorvastatin (LIPITOR) 20 MG tablet Take 20 mg by mouth daily.   Yes Historical Provider, MD  cholecalciferol (VITAMIN D) 1000 UNITS tablet Take 1,000 Units by mouth daily. Takes 2   Yes Historical Provider, MD  cilostazol (PLETAL) 100 MG tablet Take 100 mg by mouth 2 (two) times daily.   Yes Historical Provider, MD  clopidogrel (PLAVIX) 75 MG tablet Take 75 mg by mouth daily.   Yes Historical Provider, MD  dapagliflozin propanediol (FARXIGA) 5 MG TABS tablet Take 5 mg by mouth daily.   Yes Historical Provider, MD  fish oil-omega-3 fatty acids 1000 MG capsule Take 1,200 mg by mouth daily.   Yes Historical Provider, MD  HYDROcodone-acetaminophen (NORCO/VICODIN) 5-325 MG tablet Take 1 tablet by mouth 2 (two) times daily as needed for moderate pain.   Yes Historical Provider, MD  insulin aspart (NOVOLOG) 100 UNIT/ML injection Inject into the skin 3 (three) times daily before meals.   Yes Historical Provider, MD  insulin glargine (LANTUS) 100 UNIT/ML injection Inject 54 Units into the skin daily.    Yes Historical Provider, MD  meloxicam (MOBIC) 15 MG tablet Take 15 mg by mouth daily.   Yes Historical Provider, MD  metFORMIN (GLUCOPHAGE) 1000 MG tablet Take 1,000 mg by mouth 2 (two) times daily with a meal.   Yes Historical Provider, MD  venlafaxine XR (EFFEXOR-XR) 75 MG 24 hr capsule Take 75 mg by mouth daily with breakfast.   Yes Historical Provider, MD   BP 83/43 mmHg  Pulse 107  Temp(Src) 97.5 F (36.4 C) (Oral)   Resp 27  Ht 5\' 6"  (1.676 m)  Wt 157 lb (71.215 kg)  BMI 25.35 kg/m2  SpO2 100% Physical Exam  Constitutional: She appears well-developed.  HENT:  Head: Atraumatic.  Neck: Neck supple.  Cardiovascular:  Tachycardia  Pulmonary/Chest: Effort normal.  Abdominal: There is tenderness.  Epigastric tenderness without rebound or guarding.  Musculoskeletal: She exhibits no edema.  Neurological: She is alert.  Skin: Skin is warm.    ED Course  Procedures (including critical care time) Labs Review Labs Reviewed  CBC - Abnormal; Notable for the following:    WBC 20.8 (*)    All other components within normal limits  URINALYSIS, ROUTINE W REFLEX MICROSCOPIC (NOT AT [facility]) - Abnormal; Notable for the following:    Glucose, UA >1000 (*)    Ketones, ur >80 (*)    All other components within normal limits  COMPREHENSIVE METABOLIC PANEL - Abnormal; Notable for the following:    Sodium 134 (*)    Chloride 96 (*)    CO2 9 (*)    Glucose, Bld 831 (*)    BUN 32 (*)    Creatinine, Ser 1.71 (*)    Total Bilirubin 1.8 (*)    GFR calc non Af Amer 33 (*)    GFR calc Af Amer 38 (*)    Anion gap 29 (*)    All other components within normal limits  URINE MICROSCOPIC-ADD ON - Abnormal; Notable for the following:    Squamous Epithelial / LPF 0-5 (*)    All other components within normal limits  CBG MONITORING, ED - Abnormal; Notable for the following:    Glucose-Capillary >600 (*)    All other components within normal limits  I-STAT CHEM 8, ED - Abnormal; Notable for the following:    Sodium 133 (*)    Chloride 99 (*)    BUN 33 (*)    Glucose, Bld >700 (*)    Calcium, Ion 1.10 (*)    Hemoglobin 16.0 (*)    HCT 47.0 (*)    All other components within normal limits  I-STAT VENOUS BLOOD GAS, ED - Abnormal; Notable for the following:    pH, Ven 7.133 (*)    pCO2, Ven 32.1 (*)    pO2, Ven 52.0 (*)    Bicarbonate 10.8 (*)    Acid-base deficit 17.0 (*)    All other components within normal  limits  CBG MONITORING, ED - Abnormal; Notable for the following:    Glucose-Capillary >600 (*)    All other components within normal limits  CBG MONITORING, ED - Abnormal; Notable for the following:    Glucose-Capillary >600 (*)    All other components within normal limits  LIPASE, BLOOD  TROPONIN I  BLOOD GAS, VENOUS  Imaging Review Dg Chest Portable 1 View  02/15/2016  CLINICAL DATA:  55 year old female with hyperglycemia and vomiting. Initial encounter. EXAM: PORTABLE CHEST 1 VIEW COMPARISON:  Chest CTA 11/29/2006 and earlier. FINDINGS: Portable AP semi upright view at 1018 hours. Sequelae of CABG re- demonstrated. Stable mild cardiomegaly. Other mediastinal contours remain normal. Increased pulmonary vascularity but no overt edema. No pneumothorax, pleural effusion or confluent pulmonary opacity. IMPRESSION: No acute cardiopulmonary abnormality ; increased pulmonary vascularity without overt edema. Electronically Signed   By: Odessa Fleming M.D.   On: 02/15/2016 10:56   I have personally reviewed and evaluated these images and lab results as part of my medical decision-making.   EKG Interpretation   Date/Time:  Wednesday Feb 15 2016 09:12:48 EDT Ventricular Rate:  97 PR Interval:  178 QRS Duration: 114 QT Interval:  404 QTC Calculation: 513 R Axis:   -15 Text Interpretation:  Sinus rhythm Biatrial enlargement Left ventricular  hypertrophy Inferior infarct, old Anterior Q waves, possibly due to LVH  Prolonged QT interval T waves peaked Confirmed by Argyle Gustafson  MD, Florance Paolillo  [phone]) on 02/15/2016 9:18:00 AM      MDM   Final diagnoses:  Diabetic ketoacidosis without coma associated with type 1 diabetes mellitus (HCC)    Patient with hyperglycemia and DKA. Mild acidosis. IV fluids given. Has some continued vomiting. Will admit to internal medicine to a step down bed. Started on insulin drip given fluid bolus.  CRITICAL CARE Performed by: Billee Cashing Total critical care  time: 30 minutes Critical care time was exclusive of separately billable procedures and treating other patients. Critical care was necessary to treat or prevent imminent or life-threatening deterioration. Critical care was time spent personally by me on the following activities: development of treatment plan with patient and/or surrogate as well as nursing, discussions with consultants, evaluation of patient's response to treatment, examination of patient, obtaining history from patient or surrogate, ordering and performing treatments and interventions, ordering and review of laboratory studies, ordering and review of radiographic studies, pulse oximetry and re-evaluation of patient's condition.       Benjiman Core, MD 02/15/16 [phone]

## 2016-02-15 NOTE — H&P (Signed)
History and Physical    [name] DOB: [birth year] DOA: 02/15/2016  PCP: No primary care provider on file.  Patient coming from:  Home    Chief Complaint: hyperglycemia  HPI: [name] is a 55 y.o. female with medical history significant for but not necessarily limited to, diabetes, PAD, and CAD, s/p CABG 2007.  ED Course: Daughter at bedside helps provide history. Around 2:00 patient began having nausea vomiting and diarrhea with some associated confusion. Her blood sugars monitor reading was "high". Daughter convinced patient to come to ED for evaluation.  Daughter does not usually witnessed patient with hyperglycemia but states patient has periods of confusion when blood sugars are extremely low. Per daughter patient had been complaining of some upper left back shoulder discomfort and occasional shortness of breath. She has also recently developed a congested cough. Patient taking Chantix. She occasionally smoke Marijuana but no other drug use.   Review of Systems: Unable to obtain. Patient intermittent confused.     Past Medical History  Diagnosis Date  . Coronary artery disease     s/p cabg 2011  . Hyperlipidemia   . Additional heart attack (anterior wall)     also inferior wall MI  . Diabetes mellitus     Past Surgical History  Procedure Laterality Date  . Coronary artery bypass graft  2008  . Cholecystectomy    . Abdominal hysterectomy    . Trigger finger release      rt middle  . Tubal ligation    . Trigger finger release  01/25/2012    Procedure: RELEASE TRIGGER FINGER/A-1 PULLEY;  Surgeon: Wyn Forsterobert V Sypher Jr., MD;  Location: [facility];  Service: Orthopedics;  Laterality: Bilateral;  left small, right small, right thumb     reports that she quit smoking 5 days ago. She does not have any smokeless tobacco history on file. She reports that she drinks alcohol. She reports that she does not use illicit drugs.  Allergies   Allergen Reactions  . Levaquin [Levofloxacin In D5w]     Family History  Problem Relation Age of Onset  . Heart disease Mother   . Heart disease Father     Prior to Admission medications   Medication Sig Start Date End Date Taking? Authorizing Provider  ALPRAZolam Prudy Feeler(XANAX) 0.5 MG tablet Take 0.5 mg by mouth 2 (two) times daily as needed for anxiety.   Yes Historical Provider, MD  aspirin 81 MG tablet Take 81 mg by mouth daily.   Yes Historical Provider, MD  atorvastatin (LIPITOR) 20 MG tablet Take 20 mg by mouth daily.   Yes Historical Provider, MD  cholecalciferol (VITAMIN D) 1000 UNITS tablet Take 1,000 Units by mouth daily. Takes 2   Yes Historical Provider, MD  cilostazol (PLETAL) 100 MG tablet Take 100 mg by mouth 2 (two) times daily.   Yes Historical Provider, MD  clopidogrel (PLAVIX) 75 MG tablet Take 75 mg by mouth daily.   Yes Historical Provider, MD  dapagliflozin propanediol (FARXIGA) 5 MG TABS tablet Take 5 mg by mouth daily.   Yes Historical Provider, MD  fish oil-omega-3 fatty acids 1000 MG capsule Take 1,200 mg by mouth daily.   Yes Historical Provider, MD  HYDROcodone-acetaminophen (NORCO/VICODIN) 5-325 MG tablet Take 1 tablet by mouth 2 (two) times daily as needed for moderate pain.   Yes Historical Provider, MD  insulin aspart (NOVOLOG) 100 UNIT/ML injection Inject into the skin 3 (three) times daily before meals.   Yes Historical  Provider, MD  insulin glargine (LANTUS) 100 UNIT/ML injection Inject 54 Units into the skin daily.    Yes Historical Provider, MD  meloxicam (MOBIC) 15 MG tablet Take 15 mg by mouth daily.   Yes Historical Provider, MD  metFORMIN (GLUCOPHAGE) 1000 MG tablet Take 1,000 mg by mouth 2 (two) times daily with a meal.   Yes Historical Provider, MD  venlafaxine XR (EFFEXOR-XR) 75 MG 24 hr capsule Take 75 mg by mouth daily with breakfast.   Yes Historical Provider, MD    Physical Exam: Filed Vitals:   02/15/16 1000 02/15/16 1030 02/15/16 1034  02/15/16 1048  BP:  90/49 108/50 108/49  Pulse: 69 100 110 106  Temp:      TempSrc:      Resp: 22 25 25 26   Height:      Weight:      SpO2: 100% 97% 97% 98%    Constitutional:  Pleasant well developed white female in NAD, calm, comfortable Filed Vitals:   02/15/16 1000 02/15/16 1030 02/15/16 1034 02/15/16 1048  BP:  90/49 108/50 108/49  Pulse: 69 100 110 106  Temp:      TempSrc:      Resp: 22 25 25 26   Height:      Weight:      SpO2: 100% 97% 97% 98%   Eyes: PER. Sluggish reaction to light. Lids and conjunctivae normal ENMT: Mucous membranes are DRY. Posterior pharynx clear of any exudate or lesions.Normal dentition.  Neck: normal, supple, no masses Respiratory: clear to auscultation bilaterally, no wheezing, no crackles. Normal respiratory effort. No accessory muscle use.  Cardiovascular: Regular rate and rhythm, no murmurs / rubs / gallops. No extremity edema. 2+ pedal pulses. No carotid bruits.  Abdomen: no tenderness, no masses palpated. No hepatomegaly. Bowel sounds positive.  Musculoskeletal: no clubbing / cyanosis. No joint deformity upper and lower extremities. Good ROM, no contractures. Normal muscle tone.  Skin: no rashes, lesions, ulcers. No induration Neurologic: CN 2-12 grossly intact. Sensation intact, Strength 5/5 in all 4.  Psychiatric: Pleasant, cooperative. Follows commands. Patient is alert, she is oriented to date, place and person    Labs on Admission: I have personally reviewed following labs and imaging studies  CBC:  Recent Labs Lab 02/15/16 0955 02/15/16 1007  WBC 20.8*  --   HGB 13.6 16.0*  HCT 42.6 47.0*  MCV 91.0  --   PLT 313  --    Basic Metabolic Panel:  Recent Labs Lab 02/15/16 0955 02/15/16 1007  NA 134* 133*  K 4.7 4.6  CL 96* 99*  CO2 9*  --   GLUCOSE 831* >700*  BUN 32* 33*  CREATININE 1.71* 0.90  CALCIUM 9.4  --    GFR: Estimated Creatinine Clearance: 72.3 mL/min (by C-G formula based on Cr of 0.9). Liver Function  Tests:  Recent Labs Lab 02/15/16 0955  AST 23  ALT 26  ALKPHOS 101  BILITOT 1.8*  PROT 6.6  ALBUMIN 3.9    Recent Labs Lab 02/15/16 0955  LIPASE 21   Cardiac Enzymes:  Recent Labs Lab 02/15/16 0955  TROPONINI <0.03   CBG:  Recent Labs Lab 02/15/16 0914 02/15/16 1049  GLUCAP >600* >600*  Urine analysis:    Component Value Date/Time   COLORURINE YELLOW 02/15/2016 0948   APPEARANCEUR CLEAR 02/15/2016 0948   LABSPEC 1.029 02/15/2016 0948   PHURINE 5.0 02/15/2016 0948   GLUCOSEU >1000* 02/15/2016 0948   HGBUR NEGATIVE 02/15/2016 0948   BILIRUBINUR NEGATIVE 02/15/2016 1610  KETONESUR >80* 02/15/2016 0948   PROTEINUR NEGATIVE 02/15/2016 0948   UROBILINOGEN 1.0 03/06/2009 1144   NITRITE NEGATIVE 02/15/2016 0948   LEUKOCYTESUR NEGATIVE 02/15/2016 0948    Radiological Exams on Admission: Dg Chest Portable 1 View  02/15/2016  CLINICAL DATA:  55 year old female with hyperglycemia and vomiting. Initial encounter. EXAM: PORTABLE CHEST 1 VIEW COMPARISON:  Chest CTA 11/29/2006 and earlier. FINDINGS: Portable AP semi upright view at 1018 hours. Sequelae of CABG re- demonstrated. Stable mild cardiomegaly. Other mediastinal contours remain normal. Increased pulmonary vascularity but no overt edema. No pneumothorax, pleural effusion or confluent pulmonary opacity. IMPRESSION: No acute cardiopulmonary abnormality ; increased pulmonary vascularity without overt edema. Electronically Signed   By: Odessa Fleming M.D.   On: 02/15/2016 10:56    EKG: Independently reviewed.   EKG Interpretation  Date/Time:  Wednesday Feb 15 2016 09:12:48 EDT Ventricular Rate:  97 PR Interval:  178 QRS Duration: 114 QT Interval:  404 QTC Calculation: 513 R Axis:   -15 Text Interpretation:  Sinus rhythm Biatrial enlargement Left ventricular hypertrophy Inferior infarct, old Anterior Q waves, possibly due to LVH Prolonged QT interval T waves peaked Confirmed by Rubin Payor  MD, NATHAN [phone]) on 02/15/2016  9:18:00 AM      IEcho  Care Everywhere 2015 Interpretation Summary A portable transthoracic echocardiogram with 2-D, color flow, and Doppler was performed. Saline contrast injection was performed. The left ventricle is normal in size. There is normal left ventricular wall thickness. There is mild (1+) tricuspid regurgitation. The aortic valve is not well visualized, but is grossly normal. There is apical akinesis. The left atrium is mildly dilated. Grade I mild diastolic dysfunction; abnormal relaxation pattern. The left ventricular ejection fraction is moderately reduced (40-45%). No gross evidence of intracardiac shunt by saline contrast study.  Left Ventricle The left ventricle is normal in size. There is normal left ventricular wall thickness. There is apical akinesis. Lateral wall hypokinesis. The left ventricular ejection fraction is moderately reduced (40-45%). Grade I mild diastolic dysfunction; abnormal relaxation pattern.   Right Ventricle The right ventricle is grossly normal in size and function.  Atria The left atrium is mildly dilated. No gross evidence of intracardiac shunt by saline contrast study. The right atrium is normal.  Mitral Valve The mitral valve is mildly thickened. There is trace mitral regurgitation. Tricuspid Valve The tricuspid valve is grossly normal. There is mild (1+) tricuspid regurgitation.   Assessment/Plan   Active Problems:   Diabetic ketoacidosis (HCC)   CAD (coronary artery disease)   Depression   DKA (diabetic ketoacidoses) (HCC)   Anxiety   Hyperlipidemia   Combined systolic and diastolic heart failure (HCC)   Peripheral neuropathy (HCC)   Leukocytosis    Diabetic ketoacidosis.Glucose > 700, anion gap 29, ketonuria. Venous blood gas with pH of 7.1, bicarb 20.8. On oral agents and insulin at home, converting to type I DM ?  -Admit to stepdown -Hold home metformin and all oral diabetic agents -DKA phase I order set  utilized -Give extra liter of fluid  (total 3 liters) given soft blood pressure -Low threshold for involving Critical Care Medicine  Leukocytosis, WBC 20. Reactive? Infectious? She had nausea, vomiting, diarrhea around 2am. Could have gastroenteritis or GI symptoms just secondary to DKA.  -continue supportive care with IVF, anti-emetics -CXR unrevealing -U/A not suspicious  CAD. She underwent CABG 2007 for severe three-vessel coronary artery disease with severe left ventricular dysfunction, class IV congestive heart failure.  History of combined systolic and diastolic heart failure based on echocardiogram (Care Everywhere ) 2015. Not on diuretics at home. -Chest x-ray shows acute increased pulmonary vascularity without overt edema, mild stable cardiomegaly -Obtain stat echocardiogram as we are aggressively hydrating patient -I&0 -daily wts  Tobacco abuse. On Chantix at home.  -Continue Chantix when taking PO  Peripheral neuropathy.  -On chronic pain medication  -IV analgesics as needed for now.   Depression. Stable. -Resume antidepressant when taking by mouth  Peripheral artery disease.  Resume Pletal and Plavix tomorrow  Anxiety. -Continue home Xanax as needed when taking by mouth. May use IV lorazepam as needed in the interim  Hyperlipidemia. -Continue home statin when taking by mouth   DVT prophylaxis:   Lovenox   Code Status:   Full code   Family Communication:  Spoke with daughter at bedside. She understands that patient will be admitted to the hospital  Disposition Plan:  Home in 24-48 hours         Consults called: None Admission status:  Observation - Medical bed / telemetry  Admission-  Medical bed  Telemetry    Willette Cluster NP [facility] Pager [phone]  If 7PM-7AM, please contact night-coverage [email or web address] Password [email or web address]  02/15/2016, 11:18 AM

## 2016-02-15 NOTE — ED Notes (Signed)
GEMS from home with c/o N/V and abd pain onset 0030, CBG high, 12 lead changes?, 20 right hand, VSS  8mg  zofran

## 2016-02-15 NOTE — ED Notes (Signed)
MD at bedside. 

## 2016-02-15 NOTE — ED Notes (Signed)
NP at bedside.

## 2016-02-15 NOTE — ED Notes (Signed)
Pt's daughter reports pt disoriented, "She doesn't even know where she is right now."  This RN assessed pt, pt oriented x 4, knows age and month.  Pt states she is "just sleeping"

## 2016-02-15 NOTE — ED Notes (Signed)
Critcal Lab value:  831 glucose Dr. Rubin PayorPickering made aware

## 2016-02-15 NOTE — ED Notes (Signed)
RN arrived to find blood on patient gown and top sheet; RIGHT hand IV has been removed by patient; dressing applied and two attempts to start new IV made without success

## 2016-02-16 ENCOUNTER — Encounter (HOSPITAL_COMMUNITY): Payer: Self-pay | Admitting: *Deleted

## 2016-02-16 DIAGNOSIS — I272 Other secondary pulmonary hypertension: Secondary | ICD-10-CM

## 2016-02-16 DIAGNOSIS — F419 Anxiety disorder, unspecified: Secondary | ICD-10-CM

## 2016-02-16 DIAGNOSIS — I2510 Atherosclerotic heart disease of native coronary artery without angina pectoris: Secondary | ICD-10-CM | POA: Diagnosis present

## 2016-02-16 DIAGNOSIS — F329 Major depressive disorder, single episode, unspecified: Secondary | ICD-10-CM

## 2016-02-16 DIAGNOSIS — I5022 Chronic systolic (congestive) heart failure: Secondary | ICD-10-CM | POA: Diagnosis present

## 2016-02-16 DIAGNOSIS — E1110 Type 2 diabetes mellitus with ketoacidosis without coma: Secondary | ICD-10-CM | POA: Diagnosis present

## 2016-02-16 DIAGNOSIS — G6289 Other specified polyneuropathies: Secondary | ICD-10-CM

## 2016-02-16 LAB — CBC WITH DIFFERENTIAL/PLATELET
BASOS PCT: 0 %
Basophils Absolute: 0 10*3/uL (ref 0.0–0.1)
EOS PCT: 0 %
Eosinophils Absolute: 0 10*3/uL (ref 0.0–0.7)
HEMATOCRIT: 41.4 % (ref 36.0–46.0)
HEMOGLOBIN: 14.1 g/dL (ref 12.0–15.0)
Lymphocytes Relative: 8 %
Lymphs Abs: 2 10*3/uL (ref 0.7–4.0)
MCH: 29.1 pg (ref 26.0–34.0)
MCHC: 34.1 g/dL (ref 30.0–36.0)
MCV: 85.5 fL (ref 78.0–100.0)
MONO ABS: 2 10*3/uL — AB (ref 0.1–1.0)
MONOS PCT: 8 %
NEUTROS PCT: 84 %
Neutro Abs: 21.3 10*3/uL — ABNORMAL HIGH (ref 1.7–7.7)
PLATELETS: 338 10*3/uL (ref 150–400)
RBC: 4.84 MIL/uL (ref 3.87–5.11)
RDW: 13.9 % (ref 11.5–15.5)
WBC: 25.3 10*3/uL — AB (ref 4.0–10.5)

## 2016-02-16 LAB — BASIC METABOLIC PANEL
ANION GAP: 11 (ref 5–15)
Anion gap: 12 (ref 5–15)
BUN: 29 mg/dL — AB (ref 6–20)
BUN: 30 mg/dL — ABNORMAL HIGH (ref 6–20)
CALCIUM: 8.8 mg/dL — AB (ref 8.9–10.3)
CHLORIDE: 112 mmol/L — AB (ref 101–111)
CO2: 19 mmol/L — ABNORMAL LOW (ref 22–32)
CO2: 22 mmol/L (ref 22–32)
CREATININE: 1.02 mg/dL — AB (ref 0.44–1.00)
CREATININE: 1.04 mg/dL — AB (ref 0.44–1.00)
Calcium: 8.9 mg/dL (ref 8.9–10.3)
Chloride: 116 mmol/L — ABNORMAL HIGH (ref 101–111)
GFR calc Af Amer: >60 mL/min (ref >60)
GFR calc non Af Amer: >60 mL/min (ref >60)
GFR, EST NON AFRICAN AMERICAN: 60 mL/min — AB (ref >60)
GLUCOSE: 91 mg/dL (ref 65–99)
Glucose, Bld: 174 mg/dL — ABNORMAL HIGH (ref 65–99)
POTASSIUM: 4.3 mmol/L (ref 3.5–5.1)
Potassium: 4.9 mmol/L (ref 3.5–5.1)
SODIUM: 146 mmol/L — AB (ref 135–145)
Sodium: 146 mmol/L — ABNORMAL HIGH (ref 135–145)

## 2016-02-16 LAB — LIPID PANEL
CHOL/HDL RATIO: 2.1 ratio
CHOLESTEROL: 121 mg/dL (ref 0–200)
HDL: 59 mg/dL (ref >40)
LDL Cholesterol: 45 mg/dL (ref 0–99)
Triglycerides: 86 mg/dL (ref <150)
VLDL: 17 mg/dL (ref 0–40)

## 2016-02-16 LAB — MAGNESIUM: MAGNESIUM: 2 mg/dL (ref 1.7–2.4)

## 2016-02-16 LAB — CBC
HEMATOCRIT: 41.6 % (ref 36.0–46.0)
HEMOGLOBIN: 13.5 g/dL (ref 12.0–15.0)
MCH: 28.2 pg (ref 26.0–34.0)
MCHC: 32.5 g/dL (ref 30.0–36.0)
MCV: 87 fL (ref 78.0–100.0)
Platelets: 363 10*3/uL (ref 150–400)
RBC: 4.78 MIL/uL (ref 3.87–5.11)
RDW: 14 % (ref 11.5–15.5)
WBC: 25.5 10*3/uL — ABNORMAL HIGH (ref 4.0–10.5)

## 2016-02-16 LAB — RAPID URINE DRUG SCREEN, HOSP PERFORMED
Amphetamines: NOT DETECTED
Barbiturates: NOT DETECTED
Benzodiazepines: NOT DETECTED
COCAINE: NOT DETECTED
Opiates: POSITIVE — AB
TETRAHYDROCANNABINOL: NOT DETECTED

## 2016-02-16 LAB — GLUCOSE, CAPILLARY
GLUCOSE-CAPILLARY: 156 mg/dL — AB (ref 65–99)
GLUCOSE-CAPILLARY: 70 mg/dL (ref 65–99)
GLUCOSE-CAPILLARY: 169 mg/dL — AB (ref 65–99)
Glucose-Capillary: 152 mg/dL — ABNORMAL HIGH (ref 65–99)
Glucose-Capillary: 119 mg/dL — ABNORMAL HIGH (ref 65–99)
Glucose-Capillary: 121 mg/dL — ABNORMAL HIGH (ref 65–99)
Glucose-Capillary: 126 mg/dL — ABNORMAL HIGH (ref 65–99)
Glucose-Capillary: 96 mg/dL (ref 65–99)
Glucose-Capillary: 168 mg/dL — ABNORMAL HIGH (ref 65–99)

## 2016-02-16 LAB — LACTIC ACID, PLASMA: Lactic Acid, Venous: 1 mmol/L (ref 0.5–2.0)

## 2016-02-16 LAB — MRSA PCR SCREENING: MRSA by PCR: NEGATIVE

## 2016-02-16 MED ORDER — METOPROLOL TARTRATE 5 MG/5ML IV SOLN
5.0000 mg | Freq: Four times a day (QID) | INTRAVENOUS | Status: DC
  Administered 2016-02-16 – 2016-02-18 (×10): 5 mg via INTRAVENOUS
  Filled 2016-02-16 (×9): qty 5

## 2016-02-16 MED ORDER — INSULIN GLARGINE 100 UNIT/ML ~~LOC~~ SOLN
52.0000 [IU] | Freq: Every day | SUBCUTANEOUS | Status: DC
  Administered 2016-02-16: 52 [IU] via SUBCUTANEOUS
  Filled 2016-02-16 (×2): qty 0.52

## 2016-02-16 MED ORDER — PROCHLORPERAZINE EDISYLATE 5 MG/ML IJ SOLN
5.0000 mg | Freq: Four times a day (QID) | INTRAMUSCULAR | Status: DC | PRN
  Administered 2016-02-17: 5 mg via INTRAVENOUS
  Filled 2016-02-16 (×3): qty 1

## 2016-02-16 MED ORDER — LORAZEPAM 2 MG/ML IJ SOLN
0.5000 mg | Freq: Two times a day (BID) | INTRAMUSCULAR | Status: DC | PRN
  Administered 2016-02-16 (×2): 1 mg via INTRAVENOUS
  Filled 2016-02-16 (×2): qty 1

## 2016-02-16 MED ORDER — DEXTROSE-NACL 5-0.45 % IV SOLN
INTRAVENOUS | Status: DC
  Administered 2016-02-16 – 2016-02-18 (×4): via INTRAVENOUS

## 2016-02-16 MED ORDER — PNEUMOCOCCAL VAC POLYVALENT 25 MCG/0.5ML IJ INJ
0.5000 mL | INJECTION | INTRAMUSCULAR | Status: AC
  Administered 2016-02-17: 0.5 mL via INTRAMUSCULAR
  Filled 2016-02-16: qty 0.5

## 2016-02-16 MED ORDER — ENSURE ENLIVE PO LIQD
237.0000 mL | Freq: Two times a day (BID) | ORAL | Status: DC
  Administered 2016-02-18: 237 mL via ORAL

## 2016-02-16 MED ORDER — INSULIN ASPART 100 UNIT/ML ~~LOC~~ SOLN
0.0000 [IU] | Freq: Three times a day (TID) | SUBCUTANEOUS | Status: DC
  Administered 2016-02-16: 2 [IU] via SUBCUTANEOUS
  Administered 2016-02-18 (×2): 1 [IU] via SUBCUTANEOUS

## 2016-02-16 MED ORDER — HYDROCODONE-ACETAMINOPHEN 5-325 MG PO TABS
1.0000 | ORAL_TABLET | Freq: Two times a day (BID) | ORAL | Status: DC | PRN
  Filled 2016-02-16: qty 1

## 2016-02-16 MED ORDER — INSULIN GLARGINE 100 UNIT/ML ~~LOC~~ SOLN
55.0000 [IU] | Freq: Every day | SUBCUTANEOUS | Status: DC
  Administered 2016-02-16: 55 [IU] via SUBCUTANEOUS
  Filled 2016-02-16 (×2): qty 0.55

## 2016-02-16 MED ORDER — METOCLOPRAMIDE HCL 5 MG/ML IJ SOLN
10.0000 mg | Freq: Four times a day (QID) | INTRAMUSCULAR | Status: DC
  Administered 2016-02-16 – 2016-02-18 (×9): 10 mg via INTRAVENOUS
  Filled 2016-02-16 (×9): qty 2

## 2016-02-16 NOTE — Progress Notes (Signed)
Inpatient Diabetes Program Recommendations  AACE/ADA: New Consensus Statement on Inpatient Glycemic Control (2015)  Target Ranges:  Prepandial:   less than 140 mg/dL      Peak postprandial:   less than 180 mg/dL (1-2 hours)      Critically ill patients:  140 - 180 mg/dL  Results for Anna OlszewskiGIBSON, [place] (MRN [id number]) as of 02/16/2016 14:08  Ref. Range 02/15/2016 23:58 02/16/2016 01:07 02/16/2016 02:01 02/16/2016 03:04 02/16/2016 03:59 02/16/2016 08:01 02/16/2016 11:41  Glucose-Capillary Latest Ref Range: 65-99 mg/dL 409152 (H) 811156 (H) 914126 (H) 119 (H) 121 (H) 70 96   Review of Glycemic Control  Diabetes history: DM2 Outpatient Diabetes medications: Lantus 54 units daily, Farxiga 5 mg daily, Metformin 1000 mg BID, Novolog TID with meals (no dose noted on home med list) Current orders for Inpatient glycemic control: Lantus 55 units QHS, Novolog 0-9 units TID with meals  Inpatient Diabetes Program Recommendations: Insulin - Basal: Noted patient received Lantus 55 untis at 1:57 am on 02/16/16 at time of transition off IV insulin drip. Fasting glucose 70 mg/dl this morning at 7:828:01. May want to consider decreasing Lantus to 52 units QHS. Insulin-Correction: Please consider ordering Novolog bedtime correction scale.  NOTE: Went to speak with patient regarding diabetes and home regimen for diabetes control. Patient was asleep and patient's family reports that she just received medication for nausea and is very sleepy. Talked with patient's husband and he reports that is not certain about what exactly patient is taking for diabetes.  Will follow and try to talk with patient again when she is more alert.  Thanks, Orlando PennerMarie Dhriti Fales, RN, MSN, CDE Diabetes Coordinator Inpatient Diabetes Program [phone] (Team Pager from 8am to 5pm) [phone] ([facility] office) [phone] [facility] office) [phone] [facility] office)

## 2016-02-16 NOTE — Progress Notes (Signed)
Pt's son Apolinar JunesBrandon called, pt sleeping at the time, son asked to have pt call him and gave number. RN informed the pt when she woke up and she verbalized understanding.

## 2016-02-16 NOTE — Progress Notes (Signed)
[place] TEAM 1 - Stepdown/ICU TEAM Progress Note  Revecca Nachtigal AVW:[id number] DOB: March 13, 1961 DOA: 02/15/2016 PCP: No primary care provider on file.  Admit HPI / Brief Narrative: 55 y.o.WF PMHx Depression, Anxiety, Substance Abuse (marijuana), Tobacco abuse, DM type II uncontrolled with complications (peripheral neuropathy), PAD, CAD native artery s/p CABG 2007, MI.  Daughter at bedside helps provide history. Around 2:00 patient began having nausea vomiting and diarrhea with some associated confusion. Her blood sugars monitor reading was "high". Daughter convinced patient to come to ED for evaluation. Daughter does not usually witnessed patient with hyperglycemia but states patient has periods of confusion when blood sugars are extremely low. Per daughter patient had been complaining of some upper left back shoulder discomfort and occasional shortness of breath. She has also recently developed a congested cough. Patient taking Chantix. She occasionally smoke Marijuana but no other drug use.    HPI/Subjective: 5/11 A/O 4, positive N/V, positive abdominal pain secondary to vomiting. Negative CP negative SOB. States this is her first episode of DKA. Was started on some new medication secondary to insurance not painful previous meds (unsure which medication she recently started). States last hemoglobin A1c she believes January = 8   Assessment/Plan: Diabetic ketoacidosis. -Glucose > 700, anion gap 29, ketonuria. Venous blood gas with pH of 7.1, bicarb 20.8. On oral agents and insulin at home, converting to type I DM ?  -Hold home metformin and all oral diabetic agents  Leukocytosis,  -WBC >20. Without fever, without bands or left shift most likely Reactive - diarrhea around 2am. Could have gastroenteritis or GI symptoms just secondary to DKA.  -continue supportive care with IVF, Anti-emetics -CXR unrevealing -U/A not suspicious  CAD native artery.  -She underwent CABG 2007 for  severe three-vessel coronary artery disease with severe left ventricular dysfunction, class IV congestive heart failure.    Chronic Systolic CHF   -based on echocardiogram (Care Everywhere ) 2015. Not on diuretics at home. -Chest x-ray shows acute increased pulmonary vascularity without overt edema, mild stable cardiomegaly -Strict I&O since admission +3.3 L -Daily weight  Filed Weights   02/15/16 0911 02/16/16 0000  Weight: 71.215 kg (157 lb) 67.2 kg (148 lb 2.4 oz)  -start Metoprolol IV 5 mg  QID until she tolerates PO then switch Metoprolol 12.5 mg BID  Pulmonary hypertension -See CHF  Tobacco abuse. - On Chantix at home(Not in patients MAR).  -Continue Chantix when taking PO  Peripheral neuropathy.  -On chronic pain medication  -Patient was not on neuropathic pain medication by her MAR would start Neurontin when able to tolerate PO analgesics as needed for now.   Depression.  -Resume Effexor XR 75 mg daily when able resume PO  Peripheral artery disease.  -Resume Pletal and Plavix when able resume PO  Anxiety. -Home dose Xanax 0.5 mg  BID PRN, patient with continued nausea will substitute Ativan 0.5 -1 mg  q  12hr PRN   Hyperlipidemia. 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 Severe hypokinesis of  the anteroseptal myocardium. - Left atrium: severely dilated. - Tricuspid valve: There was mild-moderate regurgitation. - Pulmonary arteries: PA peak pressure: 58 mm Hg (S).    Culture NA  Antibiotics: NA  DVT prophylaxis: Lovenox   Devices NA   LINES / TUBES:  NA    Continuous  Infusions: . sodium chloride    . sodium chloride 100 mL/hr at 02/16/16 0401    Objective: VITAL SIGNS: Temp: 98.5 F (36.9 C) (05/11 0400) Temp Source: Oral (05/11 0400) BP: 124/60 mmHg (05/11 0400) Pulse Rate: 96 (05/11 0400) SPO2; FIO2:   Intake/Output Summary (Last 24 hours) at 02/16/16 0801 Last data filed at 02/16/16 0500  Gross per 24 hour  Intake 3864.26 ml  Output    560 ml  Net 3304.26 ml     Exam: General: A/O 4, positive N/V, positive abdominal pain secondary to vomiting, No acute respiratory distress Eyes: negative scleral hemorrhage, negative icterus ENT: Negative Runny nose, negative gingival bleeding, Neck:  Negative scars, masses, torticollis, lymphadenopathy, JVD Lungs: Clear to auscultation bilaterally without wheezes or crackles Cardiovascular: Tachycardic, Regular rhythm without murmur gallop or rub normal S1 and S2 Abdomen: Positive abdominal pain to palpation, nondistended, hypoactive bowel sounds, no rebound, no ascites, no appreciable mass Extremities: No significant cyanosis, clubbing, or edema bilateral lower extremities Psychiatric:  Negative depression, negative anxiety, negative fatigue, negative mania  Neurologic:  Cranial nerves II through XII intact, tongue/uvula midline, all extremities muscle strength 5/5, sensation intact throughout, negative dysarthria, negative expressive aphasia, negative receptive aphasia.   Data Reviewed: Basic Metabolic Panel:  Recent Labs Lab 02/15/16 1255 02/15/16 1612 02/15/16 2101 02/16/16 0001 02/16/16 0506  NA 137 144 143 146* 146*  K 4.4 3.6 4.0 4.9 4.3  CL 105 112* 111 116* 112*  CO2 8* 15* 18* 19* 22  GLUCOSE 774* 405* 190* 174* 91  BUN 35* 31* 27* 30* 29*  CREATININE 1.68* 1.48* 1.16* 1.04* 1.02*  CALCIUM 8.5* 8.8* 8.6* 8.8* 8.9  MG 2.0  --   --   --   --   PHOS 6.8*  --   --   --   --    Liver Function Tests:  Recent Labs Lab 02/15/16 0955  AST 23  ALT 26  ALKPHOS 101  BILITOT  1.8*  PROT 6.6  ALBUMIN 3.9    Recent Labs Lab 02/15/16 0955  LIPASE 21   No results for input(s): AMMONIA in the last 168 hours. CBC:  Recent Labs Lab 02/15/16 0955 02/15/16 1007 02/16/16 0506  WBC 20.8*  --  25.5*  HGB 13.6 16.0* 13.5  HCT 42.6 47.0* 41.6  MCV 91.0  --  87.0  PLT 313  --  363   Cardiac Enzymes:  Recent Labs Lab 02/15/16 0955  TROPONINI <0.03   BNP (last 3 results) No results for input(s): BNP in the last 8760 hours.  ProBNP (last 3 results) No results for input(s): PROBNP in the last 8760 hours.  CBG:  Recent Labs Lab 02/15/16 2358 02/16/16 0107 02/16/16 0201 02/16/16 0304 02/16/16 0359  GLUCAP 152* 156* 126* 119* 121*    Recent Results (from the past 240 hour(s))  MRSA PCR Screening     Status: None   Collection Time: 02/15/16 11:58 PM  Result Value Ref Range Status   MRSA by PCR NEGATIVE NEGATIVE Final    Comment:        The GeneXpert MRSA Assay (FDA approved for NASAL specimens only), is one component of a comprehensive MRSA colonization surveillance program. It is not intended to diagnose MRSA infection nor to guide or monitor treatment for MRSA infections.      Studies:  Recent x-ray studies have been  reviewed in detail by the Attending Physician  Scheduled Meds:  Scheduled Meds: . aspirin  81 mg Oral Daily  . cilostazol  100 mg Oral BID  . clopidogrel  75 mg Oral Daily  . enoxaparin (LOVENOX) injection  40 mg Subcutaneous Q24H  . feeding supplement (ENSURE ENLIVE)  237 mL Oral BID BM  . insulin aspart  0-9 Units Subcutaneous TID WC  . insulin glargine  55 Units Subcutaneous QHS  . [START ON 02/17/2016] pneumococcal 23 valent vaccine  0.5 mL Intramuscular Tomorrow-1000    Time spent on care of this patient: 40 mins   WOODS, Roselind Messier , MD  [facility] Office  [phone] Pager - [phone]  On-Call/Text Page:      [email or web address]      password [email or web address]  If 7PM-7AM, please contact  night-coverage [email or web address] Password [email or web address] 02/16/2016, 8:01 AM   LOS: 1 day   Care during the described time interval was provided by me .  I have reviewed this patient's available data, including medical history, events of note, physical examination, and all test results as part of my evaluation. I have personally reviewed and interpreted all radiology studies.   Carolyne Littles, MD [phone] Pager

## 2016-02-17 DIAGNOSIS — D72829 Elevated white blood cell count, unspecified: Secondary | ICD-10-CM

## 2016-02-17 DIAGNOSIS — I5022 Chronic systolic (congestive) heart failure: Secondary | ICD-10-CM

## 2016-02-17 DIAGNOSIS — Z794 Long term (current) use of insulin: Secondary | ICD-10-CM

## 2016-02-17 DIAGNOSIS — E1310 Other specified diabetes mellitus with ketoacidosis without coma: Principal | ICD-10-CM

## 2016-02-17 LAB — GLUCOSE, CAPILLARY
GLUCOSE-CAPILLARY: 113 mg/dL — AB (ref 65–99)
GLUCOSE-CAPILLARY: 136 mg/dL — AB (ref 65–99)
Glucose-Capillary: 119 mg/dL — ABNORMAL HIGH (ref 65–99)
Glucose-Capillary: 119 mg/dL — ABNORMAL HIGH (ref 65–99)

## 2016-02-17 LAB — HEMOGLOBIN A1C
Hgb A1c MFr Bld: 11.6 % — ABNORMAL HIGH (ref 4.8–5.6)
Mean Plasma Glucose: 286 mg/dL

## 2016-02-17 MED ORDER — PROCHLORPERAZINE EDISYLATE 5 MG/ML IJ SOLN
5.0000 mg | Freq: Four times a day (QID) | INTRAMUSCULAR | Status: DC | PRN
  Filled 2016-02-17: qty 1

## 2016-02-17 MED ORDER — MORPHINE SULFATE (PF) 2 MG/ML IV SOLN
2.0000 mg | Freq: Four times a day (QID) | INTRAVENOUS | Status: DC
  Administered 2016-02-17 – 2016-02-18 (×3): 2 mg via INTRAVENOUS
  Filled 2016-02-17 (×5): qty 1

## 2016-02-17 MED ORDER — ATORVASTATIN CALCIUM 20 MG PO TABS
20.0000 mg | ORAL_TABLET | Freq: Every day | ORAL | Status: DC
Start: 2016-02-17 — End: 2016-02-19
  Administered 2016-02-17 – 2016-02-18 (×2): 20 mg via ORAL
  Filled 2016-02-17 (×2): qty 1

## 2016-02-17 MED ORDER — HYDROCODONE-ACETAMINOPHEN 5-325 MG PO TABS
1.0000 | ORAL_TABLET | Freq: Two times a day (BID) | ORAL | Status: DC | PRN
  Administered 2016-02-17: 1 via ORAL
  Filled 2016-02-17 (×2): qty 1

## 2016-02-17 MED ORDER — TRAMADOL HCL 50 MG PO TABS
50.0000 mg | ORAL_TABLET | Freq: Four times a day (QID) | ORAL | Status: DC | PRN
  Administered 2016-02-18 (×2): 50 mg via ORAL
  Filled 2016-02-17 (×2): qty 1

## 2016-02-17 MED ORDER — ACETAMINOPHEN 325 MG PO TABS: 650.0000 mg | ORAL_TABLET | Freq: Four times a day (QID) | ORAL | Status: DC | PRN

## 2016-02-17 MED ORDER — ALPRAZOLAM 0.5 MG PO TABS
0.5000 mg | ORAL_TABLET | Freq: Two times a day (BID) | ORAL | Status: DC | PRN
  Administered 2016-02-17: 0.5 mg via ORAL
  Filled 2016-02-17: qty 1

## 2016-02-17 MED ORDER — MELOXICAM 15 MG PO TABS
15.0000 mg | ORAL_TABLET | Freq: Every day | ORAL | Status: DC
  Administered 2016-02-17: 15 mg via ORAL
  Filled 2016-02-17: qty 1

## 2016-02-17 MED ORDER — INSULIN GLARGINE 100 UNIT/ML ~~LOC~~ SOLN
44.0000 [IU] | Freq: Every day | SUBCUTANEOUS | Status: DC
  Administered 2016-02-17: 44 [IU] via SUBCUTANEOUS
  Filled 2016-02-17 (×2): qty 0.44

## 2016-02-17 MED ORDER — LORAZEPAM 2 MG/ML IJ SOLN: 0.5000 mg | INTRAMUSCULAR | Status: DC | PRN

## 2016-02-17 MED ORDER — VENLAFAXINE HCL ER 75 MG PO CP24: 75.0000 mg | ORAL_CAPSULE | Freq: Every day | ORAL | Status: DC

## 2016-02-17 NOTE — Progress Notes (Signed)
Inpatient Diabetes Program Recommendations  AACE/ADA: New Consensus Statement on Inpatient Glycemic Control (2015)  Target Ranges:  Prepandial:   less than 140 mg/dL      Peak postprandial:   less than 180 mg/dL (1-2 hours)      Critically ill patients:  140 - 180 mg/dL   Review of Glycemic Control  Noted pt was admit with DKA.  Please note that patient was prescribed Farxiga SGLT2 inhibitor which has had incidence of DKA. Thank you  Piedad ClimesGina Tyreanna Bisesi BSN, RN,CDE Inpatient Diabetes Coordinator [phone] (team pager)

## 2016-02-17 NOTE — Progress Notes (Signed)
After ativan, pt became lethargic and confused, got out of bed without assistance and urinated on the floor. Now pt continues to be nauseated (compazine given per PRN order) and tries to get up without assistance despite education. Bed alarm on. Will continue to monitor.

## 2016-02-17 NOTE — Progress Notes (Signed)
[place] TEAM 1 - Stepdown/ICU TEAM  Anna Olszewskiatricia Knueppel  ONG:[id number] DOB: 07/17/1961 DOA: 02/15/2016 PCP: No primary care provider on file.    Brief Narrative:  55yo F Hx Depression, Anxiety, Substance Abuse (marijuana), Tobacco abuse, DM2, PAD, and CAD s/p CABG 2007 who began having nausea vomiting and diarrhea with some associated confusion. Her blood sugar monitor reading was "high". Daughter convinced patient to come to ED for evaluation.   Assessment & Plan:  Diabetic ketoacidosis in uncontrolled DM2 -DKA resolved - CBG well controlled at present - A1c is 11.6  Refractory nausea and vomiting -Symptoms have not improved with resolution of DKA - UA not consistent with UTI - lipase normal - possible component of narcotic withdrawal - ?gastroparesis - administer low dose scheduled narcotic and follow sx - would not be able to tolerate oral contrast to allow CT abdom at present  CAD s/p CABG 2007 -severe three-vessel coronary artery disease with severe left ventricular dysfunction, class IV congestive heart failure.    Chronic Systolic CHF  -EF 40-45% w/ severe hypokinesis of anteroseptal myocardium - PA pressure 58mmHg Filed Weights   02/15/16 0911 02/16/16 0000  Weight: 71.215 kg (157 lb) 67.2 kg (148 lb 2.4 oz)    Tobacco abuse -Continue Chantix when taking PO  Peripheral neuropathy -On chronic pain medication which her friend reports she takes "around the clock"  Depression  Peripheral artery disease -Resume Pletal and Plavix when able resume PO  Anxiety -cont low does prn benzo as per home dosing   Hyperlipidemia -Continue home statin when able resume PO   DVT prophylaxis: lovenox  Code Status: FULL CODE Family Communication: spoke w/ room mate at bedside  Disposition Plan: SDU  Consultants:  none  Procedures:  TTE - 5/11  Antimicrobials:  none   Subjective: The patient is mildly confused and agitated.   She is moving about the bed nearly constantly.  She reports severe nausea and is frequently spitting out saliva.  She denies chest pain or pressure.  She complains of generalized cramping abdominal pain  Objective: Blood pressure 146/78, pulse 80, temperature 99.1 F (37.3 C), temperature source Oral, resp. rate 18, height 5\' 7"  (1.702 m), weight 67.2 kg (148 lb 2.4 oz), SpO2 96 %.  Intake/Output Summary (Last 24 hours) at 02/17/16 1628 Last data filed at 02/17/16 1500  Gross per 24 hour  Intake    870 ml  Output   1100 ml  Net   -230 ml   Filed Weights   02/15/16 0911 02/16/16 0000  Weight: 71.215 kg (157 lb) 67.2 kg (148 lb 2.4 oz)    Examination: General: No acute respiratory distress Lungs: Clear to auscultation bilaterally without wheezes or crackles Cardiovascular: Regular rate and rhythm without murmur gallop or rub normal S1 and S2 Abdomen: Nondistended, tender to palpation diffusely, soft, bowel sounds positive, no rebound, no ascites, no appreciable mass Extremities: No significant cyanosis, clubbing, or edema bilateral lower extremities  CBC:  Recent Labs Lab 02/15/16 0955 02/15/16 1007 02/16/16 0506 02/16/16 0827  WBC 20.8*  --  25.5* 25.3*  NEUTROABS  --   --   --  21.3*  HGB 13.6 16.0* 13.5 14.1  HCT 42.6 47.0* 41.6 41.4  MCV 91.0  --  87.0 85.5  PLT 313  --  363 338   Basic Metabolic Panel:  Recent Labs Lab 02/15/16 1255 02/15/16 1612 02/15/16 2101 02/16/16 0001 02/16/16 0506 02/16/16 0827  NA 137 144 143 146* 146*  --   K  4.4 3.6 4.0 4.9 4.3  --   CL 105 112* 111 116* 112*  --   CO2 8* 15* 18* 19* 22  --   GLUCOSE 774* 405* 190* 174* 91  --   BUN 35* 31* 27* 30* 29*  --   CREATININE 1.68* 1.48* 1.16* 1.04* 1.02*  --   CALCIUM 8.5* 8.8* 8.6* 8.8* 8.9  --   MG 2.0  --   --   --   --  2.0  PHOS 6.8*  --   --   --   --   --    GFR: Estimated Creatinine Clearance: 61.3 mL/min (by C-G formula based on Cr of 1.02).  Liver Function  Tests:  Recent Labs Lab 02/15/16 0955  AST 23  ALT 26  ALKPHOS 101  BILITOT 1.8*  PROT 6.6  ALBUMIN 3.9    Recent Labs Lab 02/15/16 0955  LIPASE 21   Cardiac Enzymes:  Recent Labs Lab 02/15/16 0955  TROPONINI <0.03    HbA1C:  Recent Labs  02/16/16 0851  HGBA1C 11.6*    CBG:  Recent Labs Lab 02/16/16 1141 02/16/16 1740 02/16/16 2201 02/17/16 0743 02/17/16 1207  GLUCAP 96 169* 168* 119* 119*    Lipid Profile:  Recent Labs  02/16/16 0827  CHOL 121  HDL 59  LDLCALC 45  TRIG 86  CHOLHDL 2.1    Urine analysis:    Component Value Date/Time   COLORURINE YELLOW 02/15/2016 0948   APPEARANCEUR CLEAR 02/15/2016 0948   LABSPEC 1.029 02/15/2016 0948   PHURINE 5.0 02/15/2016 0948   GLUCOSEU >1000* 02/15/2016 0948   HGBUR NEGATIVE 02/15/2016 0948   BILIRUBINUR NEGATIVE 02/15/2016 0948   KETONESUR >80* 02/15/2016 0948   PROTEINUR NEGATIVE 02/15/2016 0948   UROBILINOGEN 1.0 03/06/2009 1144   NITRITE NEGATIVE 02/15/2016 0948   LEUKOCYTESUR NEGATIVE 02/15/2016 0948    Recent Results (from the past 240 hour(s))  MRSA PCR Screening     Status: None   Collection Time: 02/15/16 11:58 PM  Result Value Ref Range Status   MRSA by PCR NEGATIVE NEGATIVE Final    Comment:        The GeneXpert MRSA Assay (FDA approved for NASAL specimens only), is one component of a comprehensive MRSA colonization surveillance program. It is not intended to diagnose MRSA infection nor to guide or monitor treatment for MRSA infections.      Scheduled Meds: . aspirin  81 mg Oral Daily  . atorvastatin  20 mg Oral q1800  . cilostazol  100 mg Oral BID  . clopidogrel  75 mg Oral Daily  . enoxaparin (LOVENOX) injection  40 mg Subcutaneous Q24H  . feeding supplement (ENSURE ENLIVE)  237 mL Oral BID BM  . insulin aspart  0-9 Units Subcutaneous TID WC  . insulin glargine  52 Units Subcutaneous QHS  . meloxicam  15 mg Oral Daily  . metoCLOPramide (REGLAN) injection  10 mg  Intravenous Q6H  . metoprolol  5 mg Intravenous Q6H  . [START ON 02/18/2016] venlafaxine XR  75 mg Oral Q breakfast   Continuous Infusions: . sodium chloride    . dextrose 5 % and 0.45% NaCl 75 mL/hr at 02/17/16 1502     LOS: 2 days   Time spent: 35 minutes   Lonia Blood, MD [facility] Office  [phone] Pager - Text Page per Amion as per below:  On-Call/Text Page:      [email or web address]      password [email or web address]  If 7PM-7AM,  please contact night-coverage [email or web address] Password [facility] 02/17/2016, 4:28 PM

## 2016-02-18 DIAGNOSIS — E785 Hyperlipidemia, unspecified: Secondary | ICD-10-CM

## 2016-02-18 DIAGNOSIS — Z720 Tobacco use: Secondary | ICD-10-CM

## 2016-02-18 LAB — COMPREHENSIVE METABOLIC PANEL WITH GFR
ALT: 21 U/L (ref 14–54)
AST: 25 U/L (ref 15–41)
Albumin: 2.8 g/dL — ABNORMAL LOW (ref 3.5–5.0)
Alkaline Phosphatase: 82 U/L (ref 38–126)
Anion gap: 11 (ref 5–15)
BUN: 11 mg/dL (ref 6–20)
CO2: 25 mmol/L (ref 22–32)
Calcium: 8.3 mg/dL — ABNORMAL LOW (ref 8.9–10.3)
Chloride: 103 mmol/L (ref 101–111)
Creatinine, Ser: 0.53 mg/dL (ref 0.44–1.00)
GFR calc Af Amer: >60 mL/min
GFR calc non Af Amer: >60 mL/min
Glucose, Bld: 70 mg/dL (ref 65–99)
Potassium: 2.7 mmol/L — CL (ref 3.5–5.1)
Sodium: 139 mmol/L (ref 135–145)
Total Bilirubin: 0.9 mg/dL (ref 0.3–1.2)
Total Protein: 5.7 g/dL — ABNORMAL LOW (ref 6.5–8.1)

## 2016-02-18 LAB — GLUCOSE, CAPILLARY
GLUCOSE-CAPILLARY: 144 mg/dL — AB (ref 65–99)
Glucose-Capillary: 71 mg/dL (ref 65–99)
Glucose-Capillary: 142 mg/dL — ABNORMAL HIGH (ref 65–99)
Glucose-Capillary: 95 mg/dL (ref 65–99)

## 2016-02-18 LAB — LIPASE, BLOOD: Lipase: 13 U/L (ref 11–51)

## 2016-02-18 LAB — AMMONIA: AMMONIA: 56 umol/L — AB (ref 9–35)

## 2016-02-18 LAB — CBC
HCT: 40.5 % (ref 36.0–46.0)
HEMOGLOBIN: 13.3 g/dL (ref 12.0–15.0)
MCH: 28.1 pg (ref 26.0–34.0)
MCHC: 32.8 g/dL (ref 30.0–36.0)
MCV: 85.4 fL (ref 78.0–100.0)
PLATELETS: 250 10*3/uL (ref 150–400)
RBC: 4.74 MIL/uL (ref 3.87–5.11)
RDW: 13.5 % (ref 11.5–15.5)
WBC: 9.9 10*3/uL (ref 4.0–10.5)

## 2016-02-18 MED ORDER — OMEGA-3-ACID ETHYL ESTERS 1 G PO CAPS
1.0000 g | ORAL_CAPSULE | Freq: Every day | ORAL | Status: DC
  Administered 2016-02-18 – 2016-02-19 (×2): 1 g via ORAL
  Filled 2016-02-18 (×2): qty 1

## 2016-02-18 MED ORDER — ALPRAZOLAM 0.5 MG PO TABS: 0.5000 mg | ORAL_TABLET | Freq: Two times a day (BID) | ORAL | Status: DC | PRN

## 2016-02-18 MED ORDER — POTASSIUM CHLORIDE CRYS ER 20 MEQ PO TBCR: 40.0000 meq | EXTENDED_RELEASE_TABLET | Freq: Once | ORAL | Status: DC

## 2016-02-18 MED ORDER — ACETAMINOPHEN 500 MG PO TABS: 500.0000 mg | ORAL_TABLET | Freq: Four times a day (QID) | ORAL | Status: DC | PRN

## 2016-02-18 MED ORDER — VENLAFAXINE HCL ER 75 MG PO CP24
75.0000 mg | ORAL_CAPSULE | Freq: Every day | ORAL | Status: DC
  Administered 2016-02-19: 75 mg via ORAL
  Filled 2016-02-18: qty 1

## 2016-02-18 MED ORDER — ONDANSETRON HCL 4 MG/2ML IJ SOLN: 4.0000 mg | INTRAMUSCULAR | Status: DC | PRN

## 2016-02-18 MED ORDER — INSULIN GLARGINE 100 UNIT/ML ~~LOC~~ SOLN
30.0000 [IU] | Freq: Every day | SUBCUTANEOUS | Status: DC
  Filled 2016-02-18 (×2): qty 0.3

## 2016-02-18 MED ORDER — OMEGA-3 FATTY ACIDS 1000 MG PO CAPS: 1200.0000 mg | ORAL_CAPSULE | Freq: Every day | ORAL | Status: DC

## 2016-02-18 MED ORDER — POTASSIUM CHLORIDE CRYS ER 20 MEQ PO TBCR
20.0000 meq | EXTENDED_RELEASE_TABLET | Freq: Once | ORAL | Status: AC
  Administered 2016-02-18: 20 meq via ORAL
  Filled 2016-02-18: qty 1

## 2016-02-18 MED ORDER — POTASSIUM CHLORIDE CRYS ER 20 MEQ PO TBCR
30.0000 meq | EXTENDED_RELEASE_TABLET | ORAL | Status: AC
  Administered 2016-02-18 (×3): 30 meq via ORAL
  Filled 2016-02-18 (×3): qty 1

## 2016-02-18 MED ORDER — MORPHINE SULFATE (PF) 2 MG/ML IV SOLN: 2.0000 mg | INTRAVENOUS | Status: DC | PRN

## 2016-02-18 MED ORDER — HYDROCODONE-ACETAMINOPHEN 5-325 MG PO TABS: 1.0000 | ORAL_TABLET | Freq: Two times a day (BID) | ORAL | Status: DC | PRN

## 2016-02-18 NOTE — Progress Notes (Signed)
Amb in hallway and tol  well

## 2016-02-18 NOTE — Progress Notes (Signed)
Patient gets out of bed without assistance despite education and husband at bedside continues to get patient out of bed.

## 2016-02-18 NOTE — Progress Notes (Signed)
[place] TEAM 1 - Stepdown/ICU TEAM  [name]  WJX:[id number] DOB: August 19, 1961 DOA: 02/15/2016 PCP: No primary care provider on file.    Brief Narrative:  55yo F Hx Depression, Anxiety, Substance Abuse (marijuana), Tobacco abuse, DM2, PAD, and CAD s/p CABG 2007 who began having nausea vomiting and diarrhea with some associated confusion. Her blood sugar monitor reading was "high". Daughter convinced patient to come to ED for evaluation.   Assessment & Plan:  Diabetic ketoacidosis in uncontrolled DM2 -DKA resolved - CBG well controlled at present - will NOT resume [place] - A1c is 11.6 - will need to go home on insulin   Refractory nausea and vomiting -Symptoms have now fully resolved - UA not consistent with UTI - lipase normal - ?component of narcotic withdrawal as seemed to resolved w/ initiation of scheduled IV narcotic - follow w/ d/c of scheduled narcotic - begin regular diet   Severe hypokalemia Replace and follow - Mg normal   CAD s/p CABG 2007 -severe three-vessel coronary artery disease - no chest pain or dyspnea      Chronic Systolic CHF  -EF 40-45% w/ severe hypokinesis of anteroseptal myocardium - PA pressure Filed Weights   02/15/16 0911 02/16/16 0000  Weight: 71.215 kg (157 lb) 67.2 kg (148 lb 2.4 oz)    Tobacco abuse -Continue Chantix when taking PO  Peripheral neuropathy -On chronic pain medication - pain well controlled at this time   Depression  Peripheral artery disease -Resume Pletal and Plavix  Anxiety -cont low does prn benzo as per home dosing   Hyperlipidemia -Continue home statin   DVT prophylaxis: lovenox  Code Status: FULL CODE Family Communication: no family present at time of exam today  Disposition Plan: probable d/c home in AM   Consultants:  none  Procedures:  TTE - 5/11 (results above)  Antimicrobials:  none   Subjective: Dramatically improved today.  Alert and oriented.    No complaints whatsoever, apart from being very hungry and wanting food.  Denies cp, sob, n/v, or abdom pain.    Objective: Blood pressure 138/78, pulse 73, temperature 97.6 F (36.4 C), temperature source Oral, resp. rate 18, height 5\' 7"  (1.702 m), weight 67.2 kg (148 lb 2.4 oz), SpO2 99 %.  Intake/Output Summary (Last 24 hours) at 02/18/16 1534 Last data filed at 02/18/16 1406  Gross per 24 hour  Intake   1680 ml  Output   1875 ml  Net   -195 ml   Filed Weights   02/15/16 0911 02/16/16 0000  Weight: 71.215 kg (157 lb) 67.2 kg (148 lb 2.4 oz)    Examination: General: No acute respiratory distress Lungs: Clear to auscultation bilaterally - no wheezes or crackles Cardiovascular: Regular rate and rhythm without murmur gallop or rub  Abdomen: Nondistended, nontender, soft, bowel sounds positive, no rebound, no ascites, no appreciable mass Extremities: No significant cyanosis, clubbing, edema bilateral lower extremities  CBC:  Recent Labs Lab 02/15/16 0955 02/15/16 1007 02/16/16 0506 02/16/16 0827 02/18/16 0445  WBC 20.8*  --  25.5* 25.3* 9.9  NEUTROABS  --   --   --  21.3*  --   HGB 13.6 16.0* 13.5 14.1 13.3  HCT 42.6 47.0* 41.6 41.4 40.5  MCV 91.0  --  87.0 85.5 85.4  PLT 313  --  363 338 250   Basic Metabolic Panel:  Recent Labs Lab 02/15/16 1255 02/15/16 1612 02/15/16 2101 02/16/16 0001 02/16/16 0506 02/16/16 0827 02/18/16 0445  NA 137 144 143  146* 146*  --  139  K 4.4 3.6 4.0 4.9 4.3  --  2.7*  CL 105 112* 111 116* 112*  --  103  CO2 8* 15* 18* 19* 22  --  25  GLUCOSE 774* 405* 190* 174* 91  --  70  BUN 35* 31* 27* 30* 29*  --  11  CREATININE 1.68* 1.48* 1.16* 1.04* 1.02*  --  0.53  CALCIUM 8.5* 8.8* 8.6* 8.8* 8.9  --  8.3*  MG 2.0  --   --   --   --  2.0  --   PHOS 6.8*  --   --   --   --   --   --    GFR: Estimated Creatinine Clearance: 78.2 mL/min (by C-G formula based on Cr of 0.53).  Liver Function Tests:  Recent Labs Lab 02/15/16 0955  02/18/16 0445  AST 23 25  ALT 26 21  ALKPHOS 101 82  BILITOT 1.8* 0.9  PROT 6.6 5.7*  ALBUMIN 3.9 2.8*    Recent Labs Lab 02/15/16 0955 02/18/16 0445  LIPASE 21 13   Cardiac Enzymes:  Recent Labs Lab 02/15/16 0955  TROPONINI <0.03    HbA1C:  Recent Labs  02/16/16 0851  HGBA1C 11.6*    CBG:  Recent Labs Lab 02/17/16 1207 02/17/16 1742 02/17/16 2139 02/18/16 0748 02/18/16 1204  GLUCAP 119* 113* 136* 71 144*    Lipid Profile:  Recent Labs  02/16/16 0827  CHOL 121  HDL 59  LDLCALC 45  TRIG 86  CHOLHDL 2.1    Urine analysis:    Component Value Date/Time   COLORURINE YELLOW 02/15/2016 0948   APPEARANCEUR CLEAR 02/15/2016 0948   LABSPEC 1.029 02/15/2016 0948   PHURINE 5.0 02/15/2016 0948   GLUCOSEU >1000* 02/15/2016 0948   HGBUR NEGATIVE 02/15/2016 0948   BILIRUBINUR NEGATIVE 02/15/2016 0948   KETONESUR >80* 02/15/2016 0948   PROTEINUR NEGATIVE 02/15/2016 0948   UROBILINOGEN 1.0 03/06/2009 1144   NITRITE NEGATIVE 02/15/2016 0948   LEUKOCYTESUR NEGATIVE 02/15/2016 0948    Recent Results (from the past 240 hour(s))  MRSA PCR Screening     Status: None   Collection Time: 02/15/16 11:58 PM  Result Value Ref Range Status   MRSA by PCR NEGATIVE NEGATIVE Final    Comment:        The GeneXpert MRSA Assay (FDA approved for NASAL specimens only), is one component of a comprehensive MRSA colonization surveillance program. It is not intended to diagnose MRSA infection nor to guide or monitor treatment for MRSA infections.      Scheduled Meds: . aspirin  81 mg Oral Daily  . atorvastatin  20 mg Oral q1800  . cilostazol  100 mg Oral BID  . clopidogrel  75 mg Oral Daily  . enoxaparin (LOVENOX) injection  40 mg Subcutaneous Q24H  . feeding supplement (ENSURE ENLIVE)  237 mL Oral BID BM  . insulin aspart  0-9 Units Subcutaneous TID WC  . insulin glargine  44 Units Subcutaneous QHS  . metoCLOPramide (REGLAN) injection  10 mg Intravenous Q6H    . metoprolol  5 mg Intravenous Q6H  .  morphine injection  2 mg Intravenous Q6H   Continuous Infusions: . dextrose 5 % and 0.45% NaCl Stopped (02/18/16 0930)     LOS: 3 days   Time spent: 35 minutes   Lonia BloodJeffrey T. McClung, MD [facility] Office  [phone] Pager - Text Page per Loretha StaplerAmion as per below:  On-Call/Text Page:  [email or web address]      password [email or web address]  If 7PM-7AM, please contact night-coverage [email or web address] Password [facility] 02/18/2016, 3:34 PM

## 2016-02-18 NOTE — Progress Notes (Signed)
CRITICAL VALUE ALERT  Critical value received:  Potassium 2.7  Date of notification:  02/18/2016   Time of notification:  0545  Critical value read back:Yes.    Nurse who received alert:  Reap, Randon Goldsmithebecca L   MD notified (1st page):  Burnadette PeterLynch, NP  Time of first page:  5:47 AM   MD notified (2nd page):  Time of second page:  Responding MD:    Time MD responded:

## 2016-02-19 LAB — CBC
HCT: 40 % (ref 36.0–46.0)
Hemoglobin: 13.4 g/dL (ref 12.0–15.0)
MCH: 28.2 pg (ref 26.0–34.0)
MCHC: 33.5 g/dL (ref 30.0–36.0)
MCV: 84 fL (ref 78.0–100.0)
PLATELETS: 230 10*3/uL (ref 150–400)
RBC: 4.76 MIL/uL (ref 3.87–5.11)
RDW: 13.3 % (ref 11.5–15.5)
WBC: 6.4 10*3/uL (ref 4.0–10.5)

## 2016-02-19 LAB — GLUCOSE, CAPILLARY
GLUCOSE-CAPILLARY: 56 mg/dL — AB (ref 65–99)
Glucose-Capillary: 187 mg/dL — ABNORMAL HIGH (ref 65–99)

## 2016-02-19 LAB — COMPREHENSIVE METABOLIC PANEL
ALBUMIN: 2.8 g/dL — AB (ref 3.5–5.0)
ALK PHOS: 82 U/L (ref 38–126)
ALT: 20 U/L (ref 14–54)
AST: 51 U/L — AB (ref 15–41)
Anion gap: 10 (ref 5–15)
BUN: 10 mg/dL (ref 6–20)
CALCIUM: 8.5 mg/dL — AB (ref 8.9–10.3)
CO2: 25 mmol/L (ref 22–32)
CREATININE: 0.59 mg/dL (ref 0.44–1.00)
Chloride: 106 mmol/L (ref 101–111)
GFR calc Af Amer: >60 mL/min (ref >60)
GFR calc non Af Amer: >60 mL/min (ref >60)
GLUCOSE: 91 mg/dL (ref 65–99)
Potassium: 3.4 mmol/L — ABNORMAL LOW (ref 3.5–5.1)
SODIUM: 141 mmol/L (ref 135–145)
Total Bilirubin: 0.9 mg/dL (ref 0.3–1.2)
Total Protein: 5.4 g/dL — ABNORMAL LOW (ref 6.5–8.1)

## 2016-02-19 NOTE — Discharge Summary (Signed)
DISCHARGE SUMMARY  [name]  MR#: [id number]  DOB:1960/11/08  Date of Admission: 02/15/2016 Date of Discharge: 02/19/2016  Attending Physician:Tillie Viverette T  Patient's PCP: PCP in [place], [place]  Consults:  none  Disposition: D/C home   Follow-up Appts:     Follow-up Information    Follow up with Your Primary Care Doctor in [place]. Schedule an appointment as soon as possible for a visit in 1 week.      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 Her blood sugar monitor reading was "high". Daughter convinced patient to come to ED for evaluation.  Hospital Course:  Diabetic ketoacidosis in uncontrolled DM2 -DKA resolved - CBG well controlled at present - will NOT resume Farxiga - A1c is 11.6 - CBG low in hospital on partial home insulin dose, suggesting very poor dietary compliance at home or noncompliance w/ meds (or both) - pt counseled on same - this makes it difficult to know how much insulin to prescribe at home - will have pt stop all oral DM meds, and resume her usual Levemir + Humalog SSI (desite what med rec says, pt assures MD she uses Levemir, not Lantus, and Humalog SSI) and that she has a full supply of both of these insulins at home - she is instructed to follow her CBGs very closely   Refractory nausea and vomiting -Symptoms fully resolved - UA not consistent with UTI - lipase normal - ?component of narcotic withdrawal as seemed to resolved w/ initiation of scheduled IV  narcotic - did not return after d/c of scheduled narcotic - tolerating regular diet at d/c   Severe hypokalemia Replaced - Mg normal   CAD s/p CABG 2007 -severe three-vessel coronary artery disease - no chest pain or dyspnea     Chronic Systolic CHF  -EF 40-45% w/ severe hypokinesis of anteroseptal myocardium - PA pressure  Tobacco abuse -Continue Chantix   Peripheral neuropathy -On chronic pain medication - pain well controlled at time of d/c - no new rx written   Depression  Peripheral artery disease -cont Pletal and Plavix  Anxiety -cont low does prn benzo as per home dosing   Hyperlipidemia -continue home statin    Medication List    STOP taking these medications        FARXIGA 5 MG Tabs tablet  Generic drug:  dapagliflozin propanediol     meloxicam 15 MG tablet  Commonly known as:  MOBIC     metFORMIN 1000 MG tablet  Commonly known as:  GLUCOPHAGE      TAKE these medications        ALPRAZolam 0.5 MG tablet  Commonly known as:  XANAX  Take 0.5 mg by mouth 2 (two) times daily as needed for anxiety.     aspirin 81 MG tablet  Take 81 mg by mouth daily.     atorvastatin 20 MG tablet  Commonly known as:  LIPITOR  Take 20 mg by mouth daily.     cholecalciferol 1000 units tablet  Commonly known as:  VITAMIN D  Take 1,000 Units by mouth daily. Takes 2  cilostazol 100 MG tablet  Commonly known as:  PLETAL  Take 100 mg by mouth 2 (two) times daily.     clopidogrel 75 MG tablet  Commonly known as:  PLAVIX  Take 75 mg by mouth daily.     fish oil-omega-3 fatty acids 1000 MG capsule  Take 1,200 mg by mouth daily.     HYDROcodone-acetaminophen 5-325 MG tablet  Commonly known as:  NORCO/VICODIN  Take 1 tablet by mouth 2 (two) times daily as needed for moderate pain.     insulin aspart 100 UNIT/ML injection  Commonly known as:  novoLOG  Inject into the skin 3 (three) times daily before meals.     insulin  glargine 100 UNIT/ML injection  Commonly known as:  LANTUS  Inject 54 Units into the skin daily.     venlafaxine XR 75 MG 24 hr capsule  Commonly known as:  EFFEXOR-XR  Take 75 mg by mouth daily with breakfast.        Day of Discharge BP 114/65 mmHg  Pulse 76  Temp(Src) 97.5 F (36.4 C) (Oral)  Resp 18  Ht 5\' 7"  (1.702 m)  Wt 67.2 kg (148 lb 2.4 oz)  BMI 23.20 kg/m2  SpO2 98%  Physical Exam: General: No acute respiratory distress Lungs: Clear to auscultation bilaterally without wheezes or crackles Cardiovascular: Regular rate and rhythm without murmur gallop or rub normal S1 and S2 Abdomen: Nontender, nondistended, soft, bowel sounds positive, no rebound, no ascites, no appreciable mass Extremities: No significant cyanosis, clubbing, or edema bilateral lower extremities  Basic Metabolic Panel:  Recent Labs Lab 02/15/16 1255  02/15/16 2101 02/16/16 0001 02/16/16 0506 02/16/16 0827 02/18/16 0445 02/19/16 0255  NA 137  < > 143 146* 146*  --  139 141  K 4.4  < > 4.0 4.9 4.3  --  2.7* 3.4*  CL 105  < > 111 116* 112*  --  103 106  CO2 8*  < > 18* 19* 22  --  25 25  GLUCOSE 774*  < > 190* 174* 91  --  70 91  BUN 35*  < > 27* 30* 29*  --  11 10  CREATININE 1.68*  < > 1.16* 1.04* 1.02*  --  0.53 0.59  CALCIUM 8.5*  < > 8.6* 8.8* 8.9  --  8.3* 8.5*  MG 2.0  --   --   --   --  2.0  --   --   PHOS 6.8*  --   --   --   --   --   --   --   < > = values in this interval not displayed.  Liver Function Tests:  Recent Labs Lab 02/15/16 0955 02/18/16 0445 02/19/16 0255  AST 23 25 51*  ALT 26 21 20   ALKPHOS 101 82 82  BILITOT 1.8* 0.9 0.9  PROT 6.6 5.7* 5.4*  ALBUMIN 3.9 2.8* 2.8*    Recent Labs Lab 02/15/16 0955 02/18/16 0445  LIPASE 21 13    Recent Labs Lab 02/18/16 0444  AMMONIA 56*   CBC:  Recent Labs Lab 02/15/16 0955 02/15/16 1007 02/16/16 0506 02/16/16 0827 02/18/16 0445 02/19/16 0255  WBC 20.8*  --  25.5* 25.3* 9.9 6.4  NEUTROABS  --   --    --  21.3*  --   --   HGB 13.6 16.0* 13.5 14.1 13.3 13.4  HCT 42.6 47.0* 41.6 41.4 40.5 40.0  MCV 91.0  --  87.0 85.5 85.4 84.0  PLT 313  --  363 338 250 230    Cardiac Enzymes:  Recent Labs Lab 02/15/16 0955  TROPONINI <0.03   CBG:  Recent Labs Lab 02/18/16 1204 02/18/16 1635 02/18/16 2129 02/19/16 0830 02/19/16 1002  GLUCAP 144* 142* 95 56* 187*    Recent Results (from the past 240 hour(s))  MRSA PCR Screening     Status: None   Collection Time: 02/15/16 11:58 PM  Result Value Ref Range Status   MRSA by PCR NEGATIVE NEGATIVE Final    Comment:        The GeneXpert MRSA Assay (FDA approved for NASAL specimens only), is one component of a comprehensive MRSA colonization surveillance program. It is not intended to diagnose MRSA infection nor to guide or monitor treatment for MRSA infections.      Time spent in discharge (includes decision making & examination of pt): >30 minutes  02/19/2016, 10:25 AM   Lonia Blood, MD [facility] Office  [phone] Pager [phone]  On-Call/Text Page:      [email or web address]      password [facility]

## 2016-02-19 NOTE — Discharge Instructions (Signed)
Diabetic Ketoacidosis °Diabetic ketoacidosis is a life-threatening complication of diabetes. If it is not treated, it can cause severe dehydration and organ damage and can lead to a coma or death. °CAUSES °This condition develops when there is not enough of the hormone insulin in the body. Insulin helps the body to break down sugar for energy. Without insulin, the body cannot break down sugar, so it breaks down fats instead. This leads to the production of acids that are called ketones. Ketones are poisonous at high levels. °This condition can be triggered by: °· Stress on the body that is brought on by an illness. °· Medicines that raise blood glucose levels. °· Not taking diabetes medicine. °SYMPTOMS °Symptoms of this condition include: °· Fatigue. °· Weight loss. °· Excessive thirst. °· Light-headedness. °· Fruity or sweet-smelling breath. °· Excessive urination. °· Vision changes. °· Confusion or irritability. °· Nausea. °· Vomiting. °· Rapid breathing. °· Abdominal pain. °· Feeling flushed. °DIAGNOSIS °This condition is diagnosed based on a medical history, a physical exam, and blood tests. You may also have a urine test that checks for ketones. °TREATMENT °This condition may be treated with: °· Fluid replacement. This may be done to correct dehydration. °· Insulin injections. These may be given through the skin or through an IV tube. °· Electrolyte replacement. Electrolytes, such as potassium and sodium, may be given in pill form or through an IV tube. °· Antibiotic medicines. These may be prescribed if your condition was caused by an infection. °HOME CARE INSTRUCTIONS °Eating and Drinking °· Drink enough fluids to keep your urine clear or pale yellow. °· If you cannot eat, alternate between drinking fluids with sugar (such as juice) and salty fluids (such as broth or bouillon). °· If you can eat, follow your usual diet and drink sugar-free liquids, such as water. °Other Instructions °· Take insulin as  directed by your health care provider. Do not skip insulin injections. Do not use expired insulin. °· If your blood sugar is over 240 mg/dL, monitor your urine ketones every 4-6 hours. °· If you were prescribed an antibiotic medicine, finish all of it even if you start to feel better. °· Rest and exercise only as directed by your health care provider. °· If you get sick, call your health care provider and begin treatment quickly. Your body often needs extra insulin to fight an illness. °· Check your blood glucose levels regularly. If your blood glucose is high, drink plenty of fluids. This helps to flush out ketones. °SEEK MEDICAL CARE IF: °· Your blood glucose level is too high or too low. °· You have ketones in your urine. °· You have a fever. °· You cannot eat. °· You cannot tolerate fluids. °· You have been vomiting for more than 2 hours. °· You continue to have symptoms of this condition. °· You develop new symptoms. °SEEK IMMEDIATE MEDICAL CARE IF: °· Your blood glucose levels continue to be high (elevated). °· Your monitor reads "high" even when you are taking insulin. °· You faint. °· You have chest pain. °· You have trouble breathing. °· You have a sudden, severe headache. °· You have sudden weakness in one arm or one leg. °· You have sudden trouble speaking or swallowing. °· You have vomiting or diarrhea that gets worse after 3 hours. °· You feel severely fatigued. °· You have trouble thinking. °· You have abdominal pain. °· You are severely dehydrated. Symptoms of severe dehydration include: °¨ Extreme thirst. °¨ Dry mouth. °¨ Blue lips. °¨   Cold hands and feet. °¨ Rapid breathing. °  °This information is not intended to replace advice given to you by your health care provider. Make sure you discuss any questions you have with your health care provider. °  °Document Released: 09/21/2000 Document Revised: 02/08/2015 Document Reviewed: 09/01/2014 °Elsevier Interactive Patient Education ©2016 [employer] ° °

## 2016-09-12 ENCOUNTER — Emergency Department (INDEPENDENT_AMBULATORY_CARE_PROVIDER_SITE_OTHER)
Admission: EM | Admit: 2016-09-12 | Discharge: 2016-09-12 | Disposition: A | Discharge status: Home / Self Care | Payer: BLUE CROSS/BLUE SHIELD | Source: Home / Self Care | Attending: Family Medicine | Admitting: Family Medicine

## 2016-09-12 DIAGNOSIS — L02811 Cutaneous abscess of head [any part, except face]: Secondary | ICD-10-CM | POA: Diagnosis not present

## 2016-09-12 MED ORDER — MUPIROCIN 2 % EX OINT: TOPICAL_OINTMENT | CUTANEOUS | 0 refills | Status: AC

## 2016-09-12 MED ORDER — DOXYCYCLINE HYCLATE 100 MG PO CAPS: 100.0000 mg | ORAL_CAPSULE | Freq: Two times a day (BID) | ORAL | 0 refills | Status: AC

## 2016-09-12 NOTE — ED Provider Notes (Signed)
CSN: [id number]     Arrival date & time 09/12/16  1825 History   First MD Initiated Contact with Patient 09/12/16 1842     Chief Complaint  Patient presents with  . Sore    left side of head   (Consider location/radiation/quality/duration/timing/severity/associated sxs/prior Treatment) HPI  Anna Olszewskiatricia Armstrong is a 55 y.o. female presenting to UC with c/o gradually worsening painful sore on the Left back side of her scalp for about 3-4 days.  Pain is aching and sore, 3/10, worse with palpation.  She has had similar sores on other parts of her body and has needed them to be drained.  She notes other times she has done well with just an oral antibiotic.  She is a Type II diabetic but monitors her sugar closely and typically has it under control.  Over the last few days she has noticed her sugars have been higher than usual; which normally happens when she gets these skin infections. Denies fever, chills, n/v/d.    Past Medical History:  Diagnosis Date  . 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 Coronary artery disease    s/p cabg 2011  . Diabetes mellitus   . Hyperlipidemia    Past Surgical History:  Procedure Laterality Date  . ABDOMINAL HYSTERECTOMY    . CHOLECYSTECTOMY    . CORONARY ARTERY BYPASS GRAFT  2008  . TRIGGER FINGER RELEASE     rt middle  . TRIGGER FINGER RELEASE  01/25/2012   Procedure: RELEASE TRIGGER FINGER/A-1 PULLEY;  Surgeon: Wyn Forsterobert V Sypher Jr., MD;  Location: [facility];  Service: Orthopedics;  Laterality: Bilateral;  left small, right small, right thumb  . TUBAL LIGATION     Family History  Problem Relation Age of Onset  . Heart disease Mother   . Heart disease Father    Social History  Substance Use Topics  . Smoking status: Former Smoker    Packs/day: 0.25    Quit date: 02/10/2016  . Smokeless tobacco: Not on file  . Alcohol use Yes     Comment: rare   OB History    No data available     Review of Systems    Constitutional: Negative for chills and fever.  HENT: Negative for ear pain, facial swelling and sore throat.   Gastrointestinal: Negative for nausea and vomiting.  Skin: Positive for wound. Negative for rash.    Allergies  Ativan [lorazepam] and Levaquin [levofloxacin in d5w]  Home Medications   Prior to Admission medications   Medication Sig Start Date End Date Taking? Authorizing Provider  ALPRAZolam Prudy Feeler(XANAX) 0.5 MG tablet Take 0.5 mg by mouth 2 (two) times daily as needed for anxiety.    Historical Provider, MD  aspirin 81 MG tablet Take 81 mg by mouth daily.    Historical Provider, MD  atorvastatin (LIPITOR) 20 MG tablet Take 20 mg by mouth daily.    Historical Provider, MD  cholecalciferol (VITAMIN D) 1000 UNITS tablet Take 1,000 Units by mouth daily. Takes 2    Historical Provider, MD  cilostazol (PLETAL) 100 MG tablet Take 100 mg by mouth 2 (two) times daily.    Historical Provider, MD  clopidogrel (PLAVIX) 75 MG tablet Take 75 mg by mouth daily.    Historical Provider, MD  doxycycline (VIBRAMYCIN) 100 MG capsule Take 1 capsule (100 mg total) by mouth 2 (two) times daily. One po bid x 7 days 09/12/16   Junius FinnerErin O'Malley, PA-C  fish  oil-omega-3 fatty acids 1000 MG capsule Take 1,200 mg by mouth daily.    Historical Provider, MD  HYDROcodone-acetaminophen (NORCO/VICODIN) 5-325 MG tablet Take 1 tablet by mouth 2 (two) times daily as needed for moderate pain.    Historical Provider, MD  insulin aspart (NOVOLOG) 100 UNIT/ML injection Inject into the skin 3 (three) times daily before meals.    Historical Provider, MD  insulin glargine (LANTUS) 100 UNIT/ML injection Inject 54 Units into the skin daily.     Historical Provider, MD  mupirocin ointment (BACTROBAN) 2 % Apply to sore 3 times daily for 5 days. 09/12/16   Junius FinnerErin O'Malley, PA-C  venlafaxine XR (EFFEXOR-XR) 75 MG 24 hr capsule Take 75 mg by mouth daily with breakfast.    Historical Provider, MD   Meds Ordered and Administered this Visit   Medications - No data to display  BP 133/74 (BP Location: Left Arm)   Pulse 74   Temp 97.8 F (36.6 C) (Oral)   Ht 5\' 6"  (1.676 m)   Wt 131 lb 6.4 oz (59.6 kg)   SpO2 98%   BMI 21.21 kg/m  No data found.   Physical Exam  Constitutional: She is oriented to person, place, and time. She appears well-developed and well-nourished. No distress.  HENT:  Head: Normocephalic. Head is with abrasion.    Mouth/Throat: Oropharynx is clear and moist.  Quarter-sized scabbed over erythematous tender nodule with mild fluctuance. Tender. No active bleeding or drainage.   Eyes: EOM are normal.  Neck: Normal range of motion.  Cardiovascular: Normal rate.   Pulmonary/Chest: Effort normal. No respiratory distress.  Musculoskeletal: Normal range of motion.  Neurological: She is alert and oriented to person, place, and time.  Skin: Skin is warm and dry. She is not diaphoretic.  Psychiatric: She has a normal mood and affect. Her behavior is normal.  Nursing note and vitals reviewed.   Urgent Care Course   Clinical Course     Procedures (including critical care time)  Labs Review Labs Reviewed - No data to display  Imaging Review No results found.    MDM   1. Scalp abscess    Pt c/o 3-4 days of worsening sore on scalp. Exam c/w abscess. Abscess is scabbed over. Discussed I&D vs antibiotic treatment and wound recheck. Pt would like to try antibiotics first.  Rx: Doxycycline and mupirocin ointment. Encouraged to keep area clean with warm soap and water. May also use warm damp compresses to help area drain. F/u in 3-4 days if not improving, sooner if worsening.    [name], PA-C 09/12/16 1901

## 2016-09-12 NOTE — ED Triage Notes (Signed)
Pt has a sore on the left side of head.  Approximately 1" diameter with a scab.

## 2016-09-14 ENCOUNTER — Telehealth: Payer: Self-pay

## 2016-09-14 NOTE — Telephone Encounter (Signed)
Unable to leave a message. VM not set up.

## 2016-09-19 ENCOUNTER — Telehealth: Payer: Self-pay | Admitting: Emergency Medicine

## 2016-09-19 NOTE — Telephone Encounter (Signed)
Called in Doxy 100mg  1 BID po, 7 days n rf as per Dr Cathren HarshBeese

## 2018-05-08 DEATH — deceased
